# Patient Record
Sex: Male | Born: 1975 | Race: White | Hispanic: No | Marital: Married | Smoking: Former smoker
Health system: Southern US, Community
[De-identification: ages and names within clinical notes are randomized; demographics above are authoritative.]

## PROBLEM LIST (undated history)

## (undated) DIAGNOSIS — K219 Gastro-esophageal reflux disease without esophagitis: Secondary | ICD-10-CM

## (undated) DIAGNOSIS — I10 Essential (primary) hypertension: Secondary | ICD-10-CM

## (undated) DIAGNOSIS — N200 Calculus of kidney: Secondary | ICD-10-CM

## (undated) HISTORY — DX: Essential (primary) hypertension: I10

## (undated) HISTORY — DX: Gastro-esophageal reflux disease without esophagitis: K21.9

---

## 2006-01-26 ENCOUNTER — Emergency Department: Payer: Self-pay | Admitting: Emergency Medicine

## 2006-12-22 ENCOUNTER — Ambulatory Visit: Payer: Self-pay | Admitting: Family Medicine

## 2007-01-08 ENCOUNTER — Ambulatory Visit: Payer: Self-pay | Admitting: Specialist

## 2007-09-03 ENCOUNTER — Ambulatory Visit (HOSPITAL_BASED_OUTPATIENT_CLINIC_OR_DEPARTMENT_OTHER): Admission: RE | Admit: 2007-09-03 | Discharge: 2007-09-03 | Payer: Self-pay | Admitting: Orthopedic Surgery

## 2007-12-10 HISTORY — PX: ELBOW SURGERY: SHX618

## 2008-06-12 ENCOUNTER — Emergency Department: Payer: Self-pay | Admitting: Emergency Medicine

## 2008-06-15 ENCOUNTER — Emergency Department: Payer: Self-pay | Admitting: Emergency Medicine

## 2008-06-15 ENCOUNTER — Other Ambulatory Visit: Payer: Self-pay

## 2010-02-13 ENCOUNTER — Ambulatory Visit: Payer: Self-pay | Admitting: Orthopedic Surgery

## 2010-02-23 ENCOUNTER — Ambulatory Visit: Payer: Self-pay | Admitting: Orthopedic Surgery

## 2010-04-09 ENCOUNTER — Encounter: Payer: Self-pay | Admitting: Physician Assistant

## 2010-04-11 ENCOUNTER — Emergency Department: Payer: Self-pay | Admitting: Unknown Physician Specialty

## 2010-04-14 ENCOUNTER — Emergency Department: Payer: Self-pay | Admitting: Emergency Medicine

## 2010-04-18 ENCOUNTER — Ambulatory Visit: Payer: Self-pay | Admitting: Anesthesiology

## 2010-04-23 ENCOUNTER — Ambulatory Visit: Payer: Self-pay | Admitting: Unknown Physician Specialty

## 2010-11-28 ENCOUNTER — Other Ambulatory Visit: Payer: Self-pay

## 2011-01-17 ENCOUNTER — Emergency Department: Payer: Self-pay | Admitting: Emergency Medicine

## 2011-04-23 NOTE — Op Note (Signed)
NAME:  Daniel Jimenez              ACCOUNT NO.:  [id number]   MEDICAL RECORD NO.:  [id number]          PATIENT TYPE:  AMB   LOCATION:  [facility]                         FACILITY:  [facility]   PHYSICIAN:  Deidre Ala, M.D.    DATE OF BIRTH:  03/06/1976   DATE OF PROCEDURE:  09/03/2007  DATE OF DISCHARGE:                               OPERATIVE REPORT   PREOPERATIVE DIAGNOSIS:  Chronic right lateral elbow epicondylitis, ECRB  tendinitis.   POSTOPERATIVE DIAGNOSIS:  Chronic right lateral elbow epicondylitis,  ECRB tendinitis.   PROCEDURE:  1. Right lateral elbow debridement ECRB scar tissue with partial      lateral epicondylectomy and drilling.  2. Repair of ECRL over top.   SURGEON:  1. Charlesetta Shanks, M.D.   ASSISTANT:  Phineas Semen, P.A.-C.   ANESTHESIA:  General with LMA.   CULTURES:  None.   DRAINS:  None.   ESTIMATED BLOOD LOSS:  Minimal.   TOURNIQUET TIME:  40 minutes.   PATHOLOGIC FINDINGS AND HISTORY:  Daniel Jimenez is a 35 year old male who is a  worker's comp injury.  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  He  was evaluated and treated for multiple injuries.  He also had bilateral  elbow pain at both lateral elbow epicondyles.  Dr. Murray Hodgkins injected both  sides multiple times, the left side improved and is much less painful,  the right side was still painful.  An MRI of his right elbow showed a  lateral elbow epicondylitis syndrome with edema and under surface  tearing at the origin of the common extensor tendon.  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  Those  were all injected on his last office visit with good improvement in each  issue.  I did not inject the right lateral elbow due to the persistence  of the tendinitis.  It was elected to proceed with the above type  procedure.   At  surgery, he had classic sharpness of the lateral elbow epicondyle  with antisopathy of the ECRL, ECRB, and common extensor tendon with the  underlying ECRB somewhat fibrillated and degenerated.  We incised  longitudinally the ECRL and dissected it off the sharp lateral elbow  epicondyle.  We then debrided the ECRB.  We then decorticated the  lateral elbow of the epicondyle with a rongeur and smoothed it to a  gentle convex surface with decortication. We then chipped it up with a  curved osteotome to further stimulate neovascularity and multiply  drilled with a 0.62 K-wire.  We then repaired the ECRL longitudinally  over top to allow it to reattach back to the lateral elbow epicondyle.  Part of the synovial fringe of the radiocapitellar joint was incised as  well as the orbicular ligament.  A good repair was obtained after  closure of the ECRL.   DESCRIPTION OF PROCEDURE:  With adequate anesthesia obtained using LMA  technique, 1 gram  Ancef given IV prophylaxis, the patient was placed in  the supine position.  The right upper extremity was prepped from the  fingertips to the tourniquet in a standard fashion.  After standard  prepping and draping, Esmarch exsanguination was used. The tourniquet  was let up to 250 mmHg.  A lateral elbow hockey stick incision was then  made.  The incision was deepened sharply with the knife and hemostasis  obtained using the Bovie electrocoagulator.  Retractors were placed and  the ECRL was incised longitudinally and carefully dissected off its  attachment on the lateral elbow epicondyle.  I then removed the  radiocapitellar joint synovial fringe and incised further  longitudinally.  I then sharply excised the ECRB out of the lateral  epicondylar recess and distally about 1.5 cm.  I then thoroughly  decorticated the lateral elbow epicondyle with a rongeur to a smooth  gentle convex surface.  I chipped it up with a short curved osteotome  and multiply drilled  it with 0.62 K-wire to stimulate neovascularity.  Irrigation was then carried out and the ECRL longitudinally was closed  with running locking 0 Vicryl, running and interrupted 2-0 Vicryl was  used on the subcu, with 3-0 Monocryl with Steri-Strips.  0.5% Marcaine  was injected in and about the wound.  A bulky sterile compressive  dressing was applied with a sling and Ace without plaster splint and the  patient, having tolerated the procedure well, was awakened and taken to  the recovery room in satisfactory condition.  He is to be discharged per  outpatient routine, given Percocet for pain, and told call the office  for an appointment for recheck on Monday.           ______________________________  V. Charlesetta Shanks, M.D.     VEP/MEDQ  D:  09/03/2007  T:  09/03/2007  Job:  [id number]   cc:   Callie Fielding, M.D.  Fax: [phone]

## 2011-09-19 LAB — POCT HEMOGLOBIN-HEMACUE
Hemoglobin: 17.8 — ABNORMAL HIGH
Operator id: 268271

## 2012-06-04 ENCOUNTER — Emergency Department: Payer: Self-pay | Admitting: Emergency Medicine

## 2012-06-04 LAB — COMPREHENSIVE METABOLIC PANEL
Alkaline Phosphatase: 106 U/L (ref 50–136)
Chloride: 105 mmol/L (ref 98–107)
Co2: 28 mmol/L (ref 21–32)
EGFR (African American): >60
EGFR (Non-African Amer.): >60
SGPT (ALT): 32 U/L
Sodium: 141 mmol/L (ref 136–145)
Total Protein: 8 g/dL (ref 6.4–8.2)

## 2012-06-04 LAB — CBC
HCT: 45.3 % (ref 40.0–52.0)
HGB: 15.3 g/dL (ref 13.0–18.0)
MCH: 30.6 pg (ref 26.0–34.0)
WBC: 13.6 10*3/uL — ABNORMAL HIGH (ref 3.8–10.6)

## 2012-06-05 LAB — URINALYSIS, COMPLETE
Bacteria: NONE SEEN
Bilirubin,UR: NEGATIVE
Glucose,UR: NEGATIVE mg/dL (ref 0–75)
Nitrite: NEGATIVE
Protein: 100
RBC,UR: 1187 /HPF (ref 0–5)
Specific Gravity: 1.025 (ref 1.003–1.030)
Squamous Epithelial: 1
WBC UR: 12 /HPF (ref 0–5)

## 2012-06-05 LAB — CK: CK, Total: 142 U/L (ref 35–232)

## 2012-06-16 ENCOUNTER — Emergency Department: Payer: Self-pay | Admitting: *Deleted

## 2012-06-16 LAB — BASIC METABOLIC PANEL
Calcium, Total: 9 mg/dL (ref 8.5–10.1)
Co2: 27 mmol/L (ref 21–32)
EGFR (African American): >60
Glucose: 99 mg/dL (ref 65–99)
Sodium: 140 mmol/L (ref 136–145)

## 2012-06-16 LAB — CBC
HGB: 14.7 g/dL (ref 13.0–18.0)
MCHC: 33.4 g/dL (ref 32.0–36.0)
Platelet: 242 10*3/uL (ref 150–440)
RBC: 4.89 10*6/uL (ref 4.40–5.90)

## 2012-06-17 LAB — URINALYSIS, COMPLETE
Bacteria: NONE SEEN
Ketone: NEGATIVE
Specific Gravity: 1.012 (ref 1.003–1.030)
Squamous Epithelial: NONE SEEN
WBC UR: <1 /HPF (ref 0–5)

## 2012-06-22 ENCOUNTER — Ambulatory Visit: Payer: Self-pay | Admitting: Urology

## 2013-12-03 ENCOUNTER — Emergency Department: Payer: Self-pay | Admitting: Emergency Medicine

## 2013-12-03 LAB — CBC WITH DIFFERENTIAL/PLATELET
Lymphocyte %: 28.2 %
MCHC: 34.1 g/dL (ref 32.0–36.0)
Monocyte #: 1 x10 3/mm (ref 0.2–1.0)
RBC: 4.94 10*6/uL (ref 4.40–5.90)
RDW: 13.3 % (ref 11.5–14.5)

## 2013-12-03 LAB — DRUG SCREEN, URINE
Amphetamines, Ur Screen: NEGATIVE (ref ?–1000)
Benzodiazepine, Ur Scrn: NEGATIVE (ref ?–200)
Cannabinoid 50 Ng, Ur ~~LOC~~: NEGATIVE (ref ?–50)
MDMA (Ecstasy)Ur Screen: NEGATIVE (ref ?–500)
Methadone, Ur Screen: NEGATIVE (ref ?–300)
Phencyclidine (PCP) Ur S: NEGATIVE (ref ?–25)
Tricyclic, Ur Screen: NEGATIVE (ref ?–1000)

## 2013-12-03 LAB — COMPREHENSIVE METABOLIC PANEL
Alkaline Phosphatase: 88 U/L
BUN: 12 mg/dL (ref 7–18)
Bilirubin,Total: 0.2 mg/dL (ref 0.2–1.0)
Osmolality: 276 (ref 275–301)
Potassium: 3.6 mmol/L (ref 3.5–5.1)
SGOT(AST): 27 U/L (ref 15–37)
SGPT (ALT): 35 U/L (ref 12–78)
Sodium: 138 mmol/L (ref 136–145)
Total Protein: 7.3 g/dL (ref 6.4–8.2)

## 2013-12-03 LAB — URINALYSIS, COMPLETE
Glucose,UR: NEGATIVE mg/dL (ref 0–75)
Ph: 7 (ref 4.5–8.0)
Protein: NEGATIVE
RBC,UR: NONE SEEN /HPF (ref 0–5)
Specific Gravity: 1.013 (ref 1.003–1.030)

## 2013-12-03 LAB — PROTIME-INR: INR: 0.9

## 2014-05-19 ENCOUNTER — Emergency Department: Payer: Self-pay | Admitting: Emergency Medicine

## 2014-05-19 LAB — CBC
HCT: 44.9 % (ref 40.0–52.0)
HGB: 14.8 g/dL (ref 13.0–18.0)
MCH: 30.2 pg (ref 26.0–34.0)
MCHC: 32.9 g/dL (ref 32.0–36.0)
MCV: 92 fL (ref 80–100)
Platelet: 260 10*3/uL (ref 150–440)
RBC: 4.9 10*6/uL (ref 4.40–5.90)
RDW: 13 % (ref 11.5–14.5)
WBC: 16.5 10*3/uL — AB (ref 3.8–10.6)

## 2014-05-19 LAB — COMPREHENSIVE METABOLIC PANEL
AST: 20 U/L (ref 15–37)
Albumin: 4.1 g/dL (ref 3.4–5.0)
Alkaline Phosphatase: 78 U/L
Anion Gap: 7 (ref 7–16)
BILIRUBIN TOTAL: 0.3 mg/dL (ref 0.2–1.0)
BUN: 12 mg/dL (ref 7–18)
CO2: 30 mmol/L (ref 21–32)
CREATININE: 0.99 mg/dL (ref 0.60–1.30)
Calcium, Total: 8.9 mg/dL (ref 8.5–10.1)
Chloride: 104 mmol/L (ref 98–107)
EGFR (Non-African Amer.): >60
Glucose: 129 mg/dL — ABNORMAL HIGH (ref 65–99)
Osmolality: 283 (ref 275–301)
POTASSIUM: 4.2 mmol/L (ref 3.5–5.1)
SGPT (ALT): 25 U/L (ref 12–78)
Sodium: 141 mmol/L (ref 136–145)
TOTAL PROTEIN: 7.1 g/dL (ref 6.4–8.2)

## 2015-04-28 ENCOUNTER — Other Ambulatory Visit: Payer: Self-pay | Admitting: Internal Medicine

## 2015-04-28 ENCOUNTER — Ambulatory Visit
Admission: RE | Admit: 2015-04-28 | Discharge: 2015-04-28 | Disposition: A | Discharge status: Home / Self Care | Payer: 59 | Source: Ambulatory Visit | Attending: Internal Medicine | Admitting: Internal Medicine

## 2015-04-28 DIAGNOSIS — M25521 Pain in right elbow: Secondary | ICD-10-CM

## 2015-04-28 DIAGNOSIS — M25522 Pain in left elbow: Secondary | ICD-10-CM | POA: Diagnosis present

## 2015-08-21 ENCOUNTER — Ambulatory Visit
Admission: RE | Admit: 2015-08-21 | Discharge: 2015-08-21 | Disposition: A | Discharge status: Home / Self Care | Payer: 59 | Source: Ambulatory Visit | Attending: Nurse Practitioner | Admitting: Nurse Practitioner

## 2015-08-21 ENCOUNTER — Other Ambulatory Visit: Payer: Self-pay | Admitting: Nurse Practitioner

## 2015-08-21 DIAGNOSIS — R05 Cough: Secondary | ICD-10-CM | POA: Insufficient documentation

## 2015-08-21 DIAGNOSIS — R059 Cough, unspecified: Secondary | ICD-10-CM

## 2016-01-01 ENCOUNTER — Ambulatory Visit
Admission: RE | Admit: 2016-01-01 | Discharge: 2016-01-01 | Disposition: A | Discharge status: Home / Self Care | Payer: 59 | Source: Ambulatory Visit | Attending: Nurse Practitioner | Admitting: Nurse Practitioner

## 2016-01-01 ENCOUNTER — Other Ambulatory Visit: Payer: Self-pay | Admitting: Nurse Practitioner

## 2016-01-01 DIAGNOSIS — M542 Cervicalgia: Secondary | ICD-10-CM | POA: Diagnosis present

## 2016-01-01 DIAGNOSIS — M545 Low back pain: Secondary | ICD-10-CM

## 2016-01-01 DIAGNOSIS — M47814 Spondylosis without myelopathy or radiculopathy, thoracic region: Secondary | ICD-10-CM | POA: Diagnosis not present

## 2016-01-01 DIAGNOSIS — R52 Pain, unspecified: Secondary | ICD-10-CM

## 2016-01-01 DIAGNOSIS — M549 Dorsalgia, unspecified: Secondary | ICD-10-CM | POA: Diagnosis present

## 2016-01-01 DIAGNOSIS — M25512 Pain in left shoulder: Secondary | ICD-10-CM | POA: Diagnosis not present

## 2016-01-22 ENCOUNTER — Other Ambulatory Visit: Payer: Self-pay | Admitting: Nurse Practitioner

## 2016-01-22 DIAGNOSIS — M542 Cervicalgia: Secondary | ICD-10-CM

## 2016-01-22 DIAGNOSIS — M5489 Other dorsalgia: Secondary | ICD-10-CM

## 2016-02-09 ENCOUNTER — Ambulatory Visit
Admission: RE | Admit: 2016-02-09 | Discharge: 2016-02-09 | Disposition: A | Discharge status: Home / Self Care | Payer: 59 | Source: Ambulatory Visit | Attending: Nurse Practitioner | Admitting: Nurse Practitioner

## 2016-02-09 DIAGNOSIS — M47812 Spondylosis without myelopathy or radiculopathy, cervical region: Secondary | ICD-10-CM | POA: Insufficient documentation

## 2016-02-09 DIAGNOSIS — M50323 Other cervical disc degeneration at C6-C7 level: Secondary | ICD-10-CM | POA: Diagnosis not present

## 2016-02-09 DIAGNOSIS — M5489 Other dorsalgia: Secondary | ICD-10-CM

## 2016-02-09 DIAGNOSIS — M5136 Other intervertebral disc degeneration, lumbar region: Secondary | ICD-10-CM | POA: Diagnosis not present

## 2016-02-09 DIAGNOSIS — M50322 Other cervical disc degeneration at C5-C6 level: Secondary | ICD-10-CM | POA: Diagnosis not present

## 2016-02-09 DIAGNOSIS — M545 Low back pain: Secondary | ICD-10-CM | POA: Insufficient documentation

## 2016-02-09 DIAGNOSIS — M50321 Other cervical disc degeneration at C4-C5 level: Secondary | ICD-10-CM | POA: Diagnosis not present

## 2016-02-09 DIAGNOSIS — M4806 Spinal stenosis, lumbar region: Secondary | ICD-10-CM | POA: Diagnosis not present

## 2016-02-09 DIAGNOSIS — M542 Cervicalgia: Secondary | ICD-10-CM | POA: Diagnosis present

## 2016-05-01 ENCOUNTER — Emergency Department
Admission: EM | Admit: 2016-05-01 | Discharge: 2016-05-01 | Disposition: A | Discharge status: Home / Self Care | Payer: 59 | Attending: Emergency Medicine | Admitting: Emergency Medicine

## 2016-05-01 ENCOUNTER — Emergency Department: Payer: 59

## 2016-05-01 ENCOUNTER — Encounter: Payer: Self-pay | Admitting: Emergency Medicine

## 2016-05-01 DIAGNOSIS — F17210 Nicotine dependence, cigarettes, uncomplicated: Secondary | ICD-10-CM | POA: Insufficient documentation

## 2016-05-01 DIAGNOSIS — N200 Calculus of kidney: Secondary | ICD-10-CM | POA: Diagnosis not present

## 2016-05-01 DIAGNOSIS — R109 Unspecified abdominal pain: Secondary | ICD-10-CM | POA: Diagnosis present

## 2016-05-01 HISTORY — DX: Calculus of kidney: N20.0

## 2016-05-01 LAB — BASIC METABOLIC PANEL
Anion gap: 7 (ref 5–15)
BUN: 13 mg/dL (ref 6–20)
CALCIUM: 9.2 mg/dL (ref 8.9–10.3)
CHLORIDE: 104 mmol/L (ref 101–111)
CO2: 29 mmol/L (ref 22–32)
CREATININE: 0.83 mg/dL (ref 0.61–1.24)
GFR calc Af Amer: >60 mL/min (ref >60)
GFR calc non Af Amer: >60 mL/min (ref >60)
Glucose, Bld: 120 mg/dL — ABNORMAL HIGH (ref 65–99)
Potassium: 3.6 mmol/L (ref 3.5–5.1)
SODIUM: 140 mmol/L (ref 135–145)

## 2016-05-01 LAB — CBC WITH DIFFERENTIAL/PLATELET
Basophils Absolute: 0.1 10*3/uL (ref 0–0.1)
EOS ABS: 0.4 10*3/uL (ref 0–0.7)
HCT: 44.5 % (ref 40.0–52.0)
HEMOGLOBIN: 15 g/dL (ref 13.0–18.0)
LYMPHS ABS: 4.7 10*3/uL — AB (ref 1.0–3.6)
Lymphocytes Relative: 32 %
MCH: 30.3 pg (ref 26.0–34.0)
MCHC: 33.7 g/dL (ref 32.0–36.0)
MCV: 89.9 fL (ref 80.0–100.0)
MONO ABS: 1.3 10*3/uL — AB (ref 0.2–1.0)
Neutro Abs: 8.6 10*3/uL — ABNORMAL HIGH (ref 1.4–6.5)
Platelets: 275 10*3/uL (ref 150–440)
RBC: 4.95 MIL/uL (ref 4.40–5.90)
RDW: 14.2 % (ref 11.5–14.5)
WBC: 15 10*3/uL — ABNORMAL HIGH (ref 3.8–10.6)

## 2016-05-01 LAB — URINALYSIS COMPLETE WITH MICROSCOPIC (ARMC ONLY)
BACTERIA UA: NONE SEEN
Bilirubin Urine: NEGATIVE
GLUCOSE, UA: NEGATIVE mg/dL
Ketones, ur: NEGATIVE mg/dL
Leukocytes, UA: NEGATIVE
Nitrite: NEGATIVE
PROTEIN: NEGATIVE mg/dL
Specific Gravity, Urine: 1.02 (ref 1.005–1.030)
pH: 5 (ref 5.0–8.0)

## 2016-05-01 MED ORDER — KETOROLAC TROMETHAMINE 30 MG/ML IJ SOLN
INTRAMUSCULAR | Status: AC
  Administered 2016-05-01: 30 mg via INTRAVENOUS
  Filled 2016-05-01: qty 1

## 2016-05-01 MED ORDER — SODIUM CHLORIDE 0.9 % IV BOLUS (SEPSIS)
1000.0000 mL | Freq: Once | INTRAVENOUS | Status: AC
  Administered 2016-05-01: 1000 mL via INTRAVENOUS

## 2016-05-01 MED ORDER — OXYCODONE-ACETAMINOPHEN 5-325 MG PO TABS
2.0000 | ORAL_TABLET | Freq: Once | ORAL | Status: AC
  Administered 2016-05-01: 2 via ORAL
  Filled 2016-05-01: qty 2

## 2016-05-01 MED ORDER — ONDANSETRON HCL 4 MG/2ML IJ SOLN
INTRAMUSCULAR | Status: AC
  Administered 2016-05-01: 4 mg via INTRAVENOUS
  Filled 2016-05-01: qty 2

## 2016-05-01 MED ORDER — TAMSULOSIN HCL 0.4 MG PO CAPS
0.4000 mg | ORAL_CAPSULE | Freq: Once | ORAL | Status: AC
  Administered 2016-05-01: 0.4 mg via ORAL
  Filled 2016-05-01: qty 1

## 2016-05-01 MED ORDER — ONDANSETRON HCL 4 MG/2ML IJ SOLN
4.0000 mg | Freq: Once | INTRAMUSCULAR | Status: AC
  Administered 2016-05-01: 4 mg via INTRAVENOUS

## 2016-05-01 MED ORDER — HYDROMORPHONE HCL 1 MG/ML IJ SOLN
INTRAMUSCULAR | Status: AC
  Administered 2016-05-01: 1 mg via INTRAVENOUS
  Filled 2016-05-01: qty 1

## 2016-05-01 MED ORDER — OXYCODONE-ACETAMINOPHEN 5-325 MG PO TABS: 1.0000 | ORAL_TABLET | ORAL | Status: DC | PRN

## 2016-05-01 MED ORDER — HYDROMORPHONE HCL 1 MG/ML IJ SOLN
1.0000 mg | Freq: Once | INTRAMUSCULAR | Status: AC
  Administered 2016-05-01: 1 mg via INTRAVENOUS

## 2016-05-01 MED ORDER — ONDANSETRON 4 MG PO TBDP: 4.0000 mg | ORAL_TABLET | Freq: Three times a day (TID) | ORAL | Status: DC | PRN

## 2016-05-01 MED ORDER — KETOROLAC TROMETHAMINE 30 MG/ML IJ SOLN
30.0000 mg | Freq: Once | INTRAMUSCULAR | Status: AC
  Administered 2016-05-01: 30 mg via INTRAVENOUS

## 2016-05-01 MED ORDER — TAMSULOSIN HCL 0.4 MG PO CAPS: 0.4000 mg | ORAL_CAPSULE | Freq: Every day | ORAL | Status: DC

## 2016-05-01 MED ORDER — HYDROMORPHONE HCL 1 MG/ML IJ SOLN
1.0000 mg | Freq: Once | INTRAMUSCULAR | Status: AC
  Administered 2016-05-01: 1 mg via INTRAVENOUS
  Filled 2016-05-01: qty 1

## 2016-05-01 MED ORDER — ONDANSETRON 4 MG PO TBDP
4.0000 mg | ORAL_TABLET | Freq: Once | ORAL | Status: AC
  Administered 2016-05-01: 4 mg via ORAL
  Filled 2016-05-01: qty 1

## 2016-05-01 NOTE — Discharge Instructions (Signed)
1. Take pain & nausea medicines as needed (Percocet/Zofran #30). Make sure to take a stool softener while taking narcotic pain medicines. 2. Take Flomax 0.4mg  daily x 14 days. 3. Drink plenty of bottled or filtered water daily. 4. Return to the ER for worsening symptoms, persistent vomiting, fever, difficulty breathing or other concerns.  Kidney Stones Kidney stones (urolithiasis) are solid masses that form inside your kidneys. The intense pain is caused by the stone moving through the kidney, ureter, bladder, and urethra (urinary tract). When the stone moves, the ureter starts to spasm around the stone. The stone is usually passed in your pee (urine).  HOME CARE  Drink enough fluids to keep your pee clear or pale yellow. This helps to get the stone out.  Take a 24-hour pee (urine) sample as told by your doctor. You may need to take another sample every 6-12 months.  Strain all pee through the provided strainer. Do not pee without peeing through the strainer, not even once. If you pee the stone out, catch it in the strainer. The stone may be as small as a grain of salt. Take this to your doctor. This will help your doctor figure out what you can do to try to prevent more kidney stones.  Only take medicine as told by your doctor.  Make changes to your daily diet as told by your doctor. You may be told to:  Limit how much salt you eat.  Eat 5 or more servings of fruits and vegetables each day.  Limit how much meat, poultry, fish, and eggs you eat.  Keep all follow-up visits as told by your doctor. This is important.  Get follow-up X-rays as told by your doctor. GET HELP IF: You have pain that gets worse even if you have been taking pain medicine. GET HELP RIGHT AWAY IF:   Your pain does not get better with medicine.  You have a fever or shaking chills.  Your pain increases and gets worse over 18 hours.  You have new belly (abdominal) pain.  You feel faint or pass out.  You are  unable to pee.   This information is not intended to replace advice given to you by your health care provider. Make sure you discuss any questions you have with your health care provider.   Document Released: 05/13/2008 Document Revised: 08/16/2015 Document Reviewed: 04/28/2013 Elsevier Interactive Patient Education 2016 Elsevier Inc.  Flank Pain Flank pain is pain in your side. The flank is the area of your side between your upper belly (abdomen) and your back. Pain in this area can be caused by many different things. HOME CARE Home care and treatment will depend on the cause of your pain.  Rest as told by your doctor.  Drink enough fluids to keep your pee (urine) clear or pale yellow.  Only take medicine as told by your doctor.  Tell your doctor about any changes in your pain.  Follow up with your doctor. GET HELP RIGHT AWAY IF:   Your pain does not get better with medicine.   You have new symptoms or your symptoms get worse.  Your pain gets worse.   You have belly (abdominal) pain.   You are short of breath.   You always feel sick to your stomach (nauseous).   You keep throwing up (vomiting).   You have puffiness (swelling) in your belly.   You feel light-headed or you pass out (faint).   You have blood in your pee.  You have a fever or lasting symptoms for more than 2-3 days.  You have a fever and your symptoms suddenly get worse. MAKE SURE YOU:   Understand these instructions.  Will watch your condition.  Will get help right away if you are not doing well or get worse.   This information is not intended to replace advice given to you by your health care provider. Make sure you discuss any questions you have with your health care provider.   Document Released: 09/03/2008 Document Revised: 12/16/2014 Document Reviewed: 07/09/2012 Elsevier Interactive Patient Education [employer]2016 Elsevier Inc.

## 2016-05-01 NOTE — ED Provider Notes (Signed)
[facility] Emergency Department Provider Note   ____________________________________________  Time seen: Approximately 4:34 AM  I have reviewed the triage vital signs and the nursing notes.   HISTORY  Chief Complaint Flank Pain    HPI Daniel Jimenez is a 40 y.o. male who presents to the ED via EMS with a chief complaint of left flank pain. Patient awoke with left flank pain radiating to left groin; onset approximately 2 AM. Symptoms associated with nausea and vomiting. Patient reports history of kidney stones and states this feels similarly. Last imaging approximately 2 years ago. He has never required lithotripsy or stenting; largest stone he passed was 5.5 mm. Denies associated fever, chills, chest pain, shortness of breath, diarrhea. States he was dribbling on urination prior to arrival. Denies recent travel or trauma. Nothing makes his symptoms better or worse.   Past Medical History Kidney stones  There are no active problems to display for this patient.   History reviewed. No pertinent past surgical history.  Current Outpatient Rx  Name  Route  Sig  Dispense  Refill  . ondansetron (ZOFRAN ODT) 4 MG disintegrating tablet   Oral   Take 1 tablet (4 mg total) by mouth every 8 (eight) hours as needed for nausea or vomiting.   30 tablet   0   . oxyCODONE-acetaminophen (ROXICET) 5-325 MG tablet   Oral   Take 1 tablet by mouth every 4 (four) hours as needed for severe pain.   30 tablet   0   . tamsulosin (FLOMAX) 0.4 MG CAPS capsule   Oral   Take 1 capsule (0.4 mg total) by mouth daily.   14 capsule   0     Allergies Review of patient's allergies indicates no known allergies.  History reviewed. No pertinent family history.  Social History Social History  Substance Use Topics  . Smoking status: Current Every Day Smoker -- 1.00 packs/day    Types: Cigarettes  . Smokeless tobacco: Never Used  . Alcohol Use: Yes     Comment: oc    Denies recent alcohol use  Review of Systems  Constitutional: No fever/chills. Eyes: No visual changes. ENT: No sore throat. Cardiovascular: Denies chest pain. Respiratory: Denies shortness of breath. Gastrointestinal: Positive for left flank pain. No abdominal pain.  Positive for nausea and vomiting.  No diarrhea.  No constipation. Genitourinary: Positive for urinary hesitancy. Negative for dysuria. Negative for testicular pain or swelling. Musculoskeletal: Negative for back pain. Skin: Negative for rash. Neurological: Negative for headaches, focal weakness or numbness.  10-point ROS otherwise negative.  ____________________________________________   PHYSICAL EXAM:  VITAL SIGNS: ED Triage Vitals  Enc Vitals Group     BP --      Pulse --      Resp --      Temp --      Temp src --      SpO2 --      Weight --      Height --      Head Cir --      Peak Flow --      Pain Score --      Pain Loc --      Pain Edu? --      Excl. in GC? --     Constitutional: Alert and oriented. Well appearing and in moderate acute distress. Eyes: Conjunctivae are normal. PERRL. EOMI. Head: Atraumatic. Nose: No congestion/rhinnorhea. Mouth/Throat: Mucous membranes are moist.  Oropharynx non-erythematous. Neck: No stridor.  Cardiovascular: Normal rate, regular rhythm. Grossly normal heart sounds.  Good peripheral circulation. Respiratory: Normal respiratory effort.  No retractions. Lungs CTAB. Gastrointestinal: Soft and nontender. No distention. No abdominal bruits. Mild left CVA tenderness. Musculoskeletal: No lower extremity tenderness nor edema.  No joint effusions. Neurologic:  Normal speech and language. No gross focal neurologic deficits are appreciated. No gait instability. Skin:  Skin is warm, dry and intact. No rash noted. Psychiatric: Mood and affect are normal. Speech and behavior are normal.  ____________________________________________   LABS (all labs ordered are  listed, but only abnormal results are displayed)  Labs Reviewed  CBC WITH DIFFERENTIAL/PLATELET - Abnormal; Notable for the following:    WBC 15.0 (*)    Neutro Abs 8.6 (*)    Lymphs Abs 4.7 (*)    Monocytes Absolute 1.3 (*)    All other components within normal limits  BASIC METABOLIC PANEL - Abnormal; Notable for the following:    Glucose, Bld 120 (*)    All other components within normal limits  URINALYSIS COMPLETEWITH MICROSCOPIC ([facility] ONLY) - Abnormal; Notable for the following:    Color, Urine YELLOW (*)    APPearance CLEAR (*)    Hgb urine dipstick 3+ (*)    Squamous Epithelial / LPF 0-5 (*)    All other components within normal limits   ____________________________________________  EKG  None ____________________________________________  RADIOLOGY  CT renal colic interpreted per Dr. Manus GunningEhinger: 1. Obstructing 3 mm stone in the distal left ureter just proximal to the ureterovesicular junction with mild hydroureteronephrosis. Over 2 nonobstructing stones in the right kidney. 2. Avascular necrosis of both femoral heads, incidentally noted. ____________________________________________   PROCEDURES  Procedure(s) performed: None  Critical Care performed: No  ____________________________________________   INITIAL IMPRESSION / ASSESSMENT AND PLAN / ED COURSE  Pertinent labs & imaging results that were available during my care of the patient were reviewed by me and considered in my medical decision making (see chart for details).  40 year old male who presents with sudden onset left flank pain, history of kidney stones. Will obtain screening lab work, administer IV analgesia and antiemetic and reassess.  ----------------------------------------- 5:38 AM on 05/01/2016 -----------------------------------------  Pain improving after second dose of Dilaudid. Will obtain CT renal study.  ----------------------------------------- 6:50 AM on  05/01/2016 -----------------------------------------  Patient was given additional oral analgesia with improvement in pain. Updated patient of laboratory and CT imaging results. Plan for prescriptions of analgesia, antiemetic, Flomax and follow up with urology as needed. Patient verbalizes understanding and agrees with plan of care. ____________________________________________   FINAL CLINICAL IMPRESSION(S) / ED DIAGNOSES  Final diagnoses:  Flank pain  Kidney stone      NEW MEDICATIONS STARTED DURING THIS VISIT:  Discharge Medication List as of 05/01/2016  6:22 AM    START taking these medications   Details  ondansetron (ZOFRAN ODT) 4 MG disintegrating tablet Take 1 tablet (4 mg total) by mouth every 8 (eight) hours as needed for nausea or vomiting., Starting 05/01/2016, Until Discontinued, Print    oxyCODONE-acetaminophen (ROXICET) 5-325 MG tablet Take 1 tablet by mouth every 4 (four) hours as needed for severe pain., Starting 05/01/2016, Until Discontinued, Print    tamsulosin (FLOMAX) 0.4 MG CAPS capsule Take 1 capsule (0.4 mg total) by mouth daily., Starting 05/01/2016, Until Discontinued, Print         Note:  This document was prepared using Dragon voice recognition software and may include unintentional dictation errors.    Irean HongJade J Toriann Spadoni, MD 05/01/16 [phone]

## 2016-05-01 NOTE — ED Notes (Signed)
Patient transported to CT 

## 2016-05-01 NOTE — ED Notes (Signed)
Pt back from CT

## 2016-05-01 NOTE — ED Notes (Signed)
Pt presents to ED via EMS with c/o left sided flank pain, onset around 200 this morning. Pt reports Hx of kidney stones.

## 2016-08-02 IMAGING — MR MR LUMBAR SPINE W/O CM
4 of 5 series · 15 of 48 positions shown · non-contrast
Comparison: Multiple exams, including 01/01/2016 an 04/18/2010

CLINICAL DATA: Low back pain radiating into the groin. Symptoms for
10 years.

EXAM:
MRI LUMBAR SPINE WITHOUT CONTRAST
TECHNIQUE: Multiplanar, multisequence MR imaging of the lumbar spine was
performed. No intravenous contrast was administered.

[Series 2: T2 · sagittal · 4.0mm · 0.44mm/px · 6 of 15 slices shown (1 of 2)]
[im 1/15]
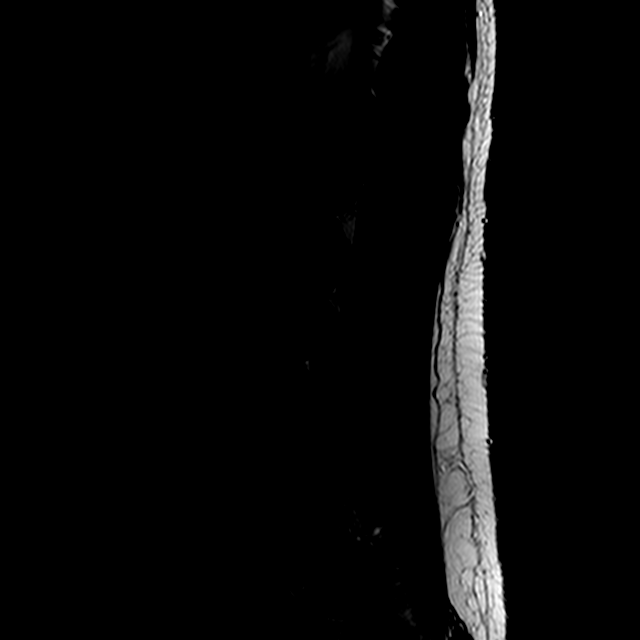
[im 3/15]
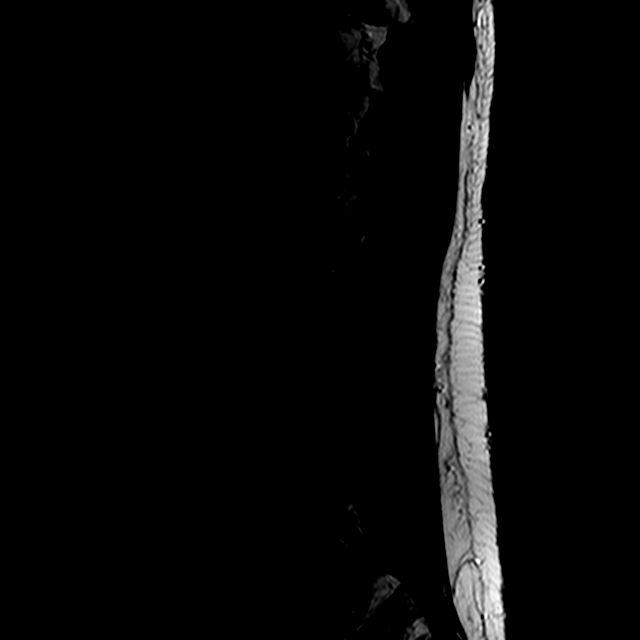
[im 6/15]
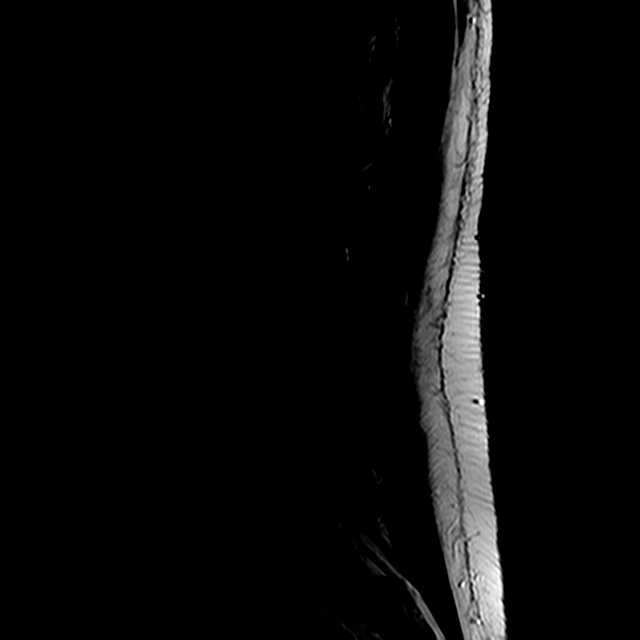
[im 9/15]
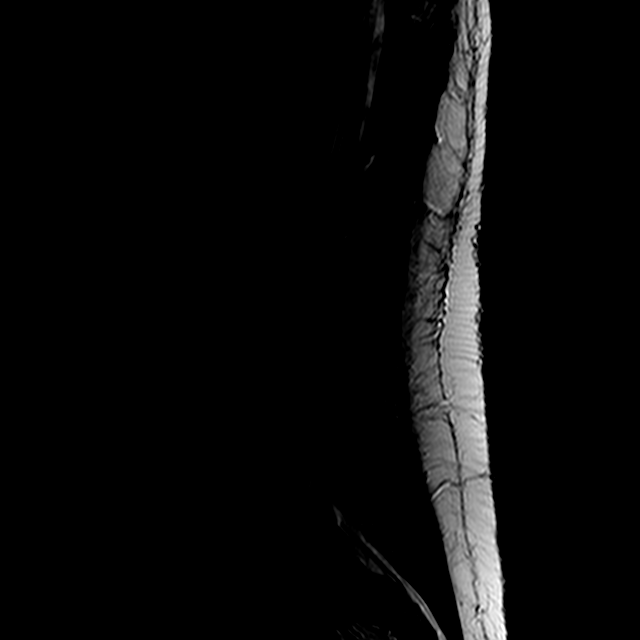
[im 12/15]
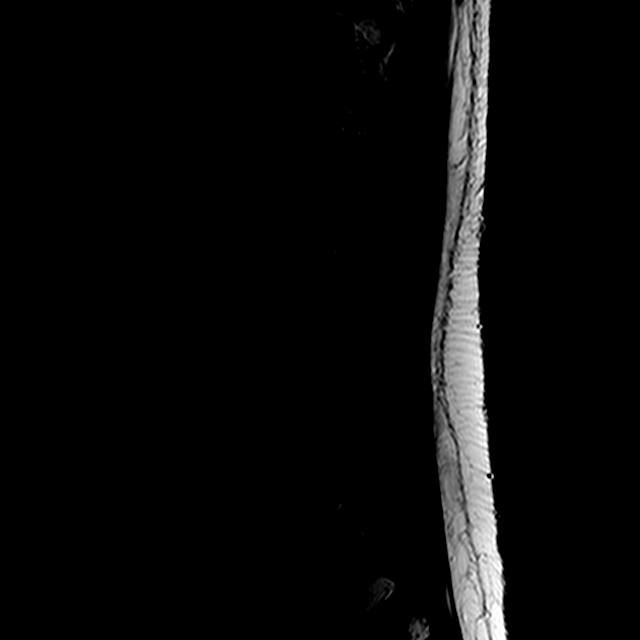
[im 15/15]
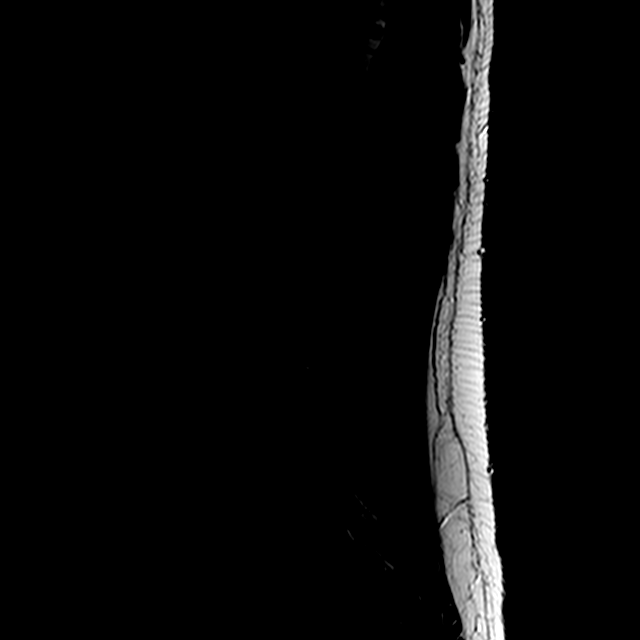

[Series 3: T1 · sagittal · 4.0mm · 0.44mm/px · 3 of 15 slices shown (1 of 2)]
[im 3/15]
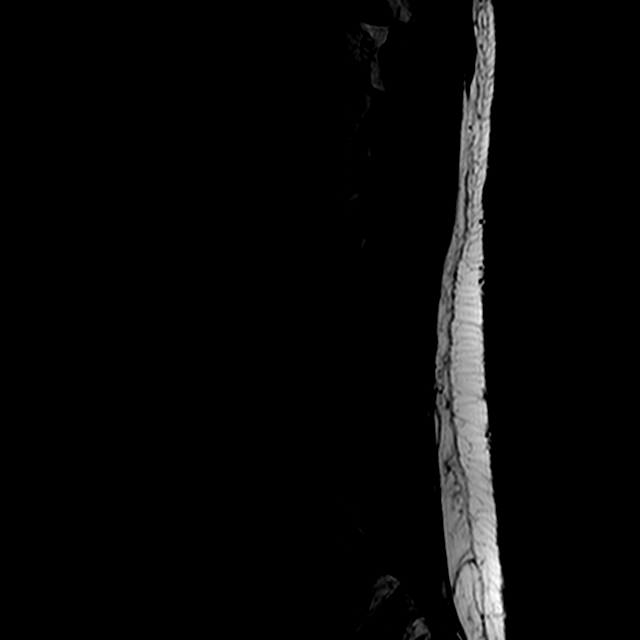
[im 9/15]
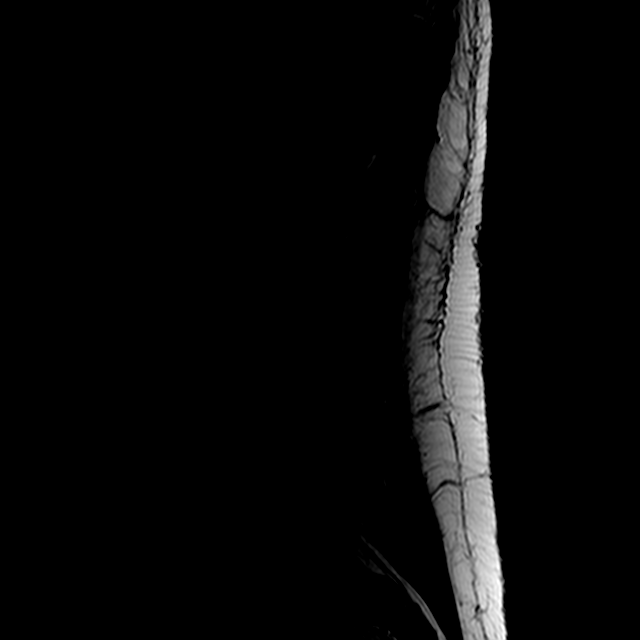
[im 15/15]
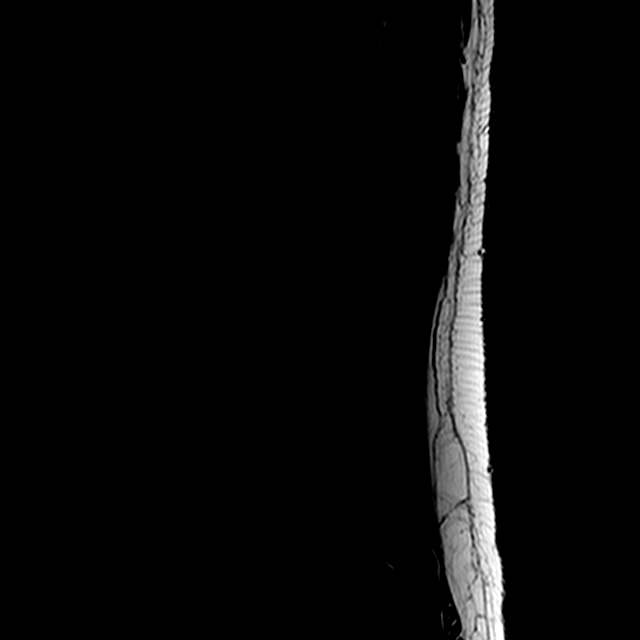

[Series 5: T2 · axial · 4.0mm · 0.39mm/px · z∈[-69,+117]mm · 3 of 37 slices shown (2 of 2)]
[im 6/37]
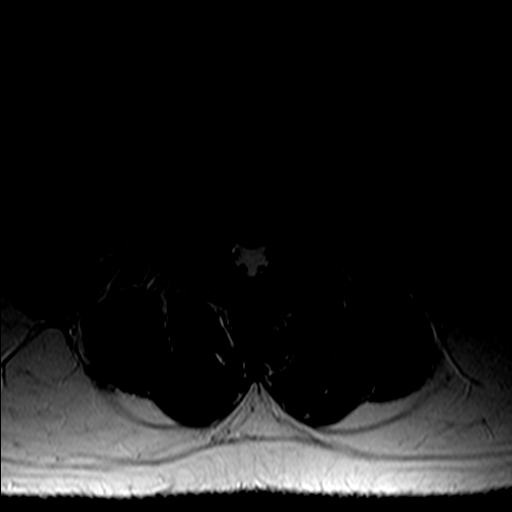
[im 19/37]
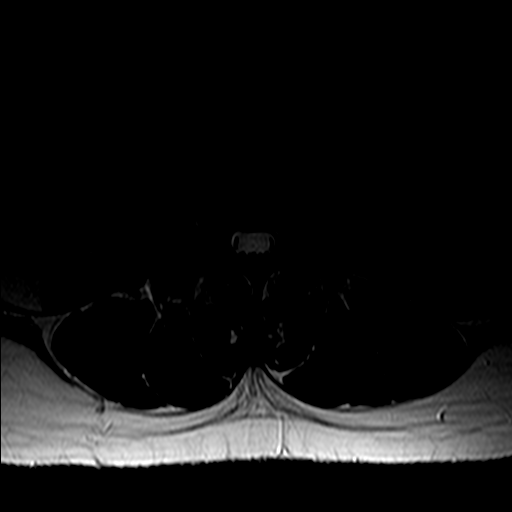
[im 31/37]
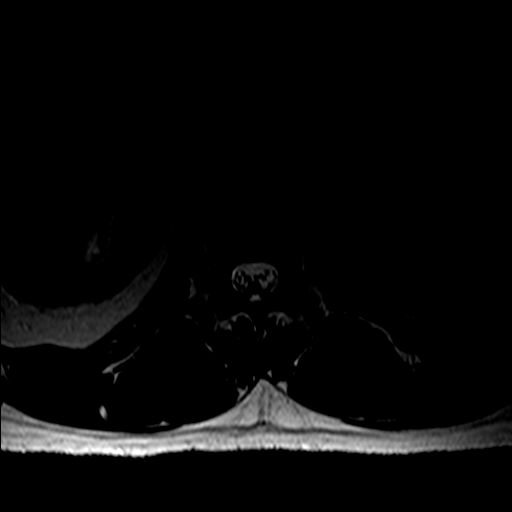

[Series 6: T1 · axial · 4.0mm · 0.39mm/px · z∈[-69,+117]mm · 3 of 37 slices shown (2 of 2)]
[im 6/37]
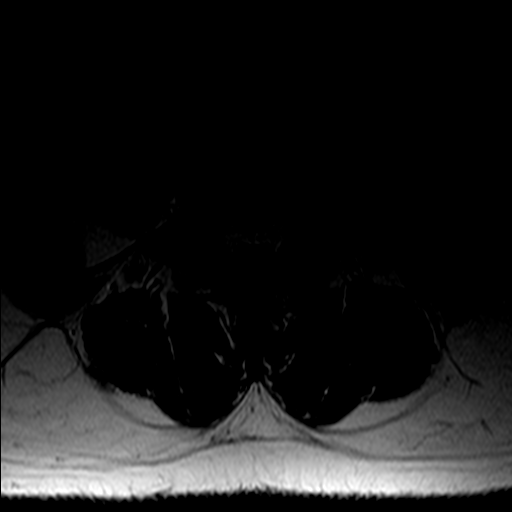
[im 19/37]
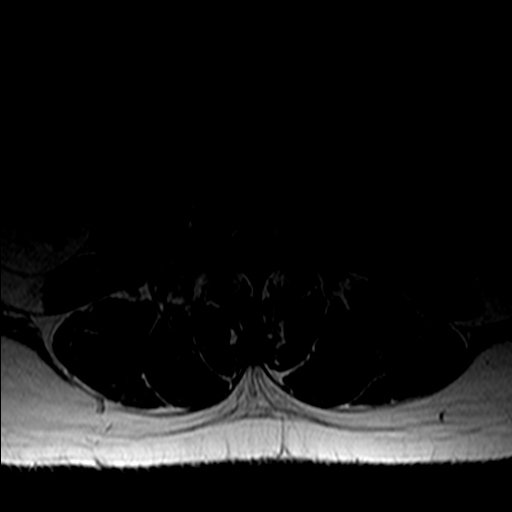
[im 31/37]
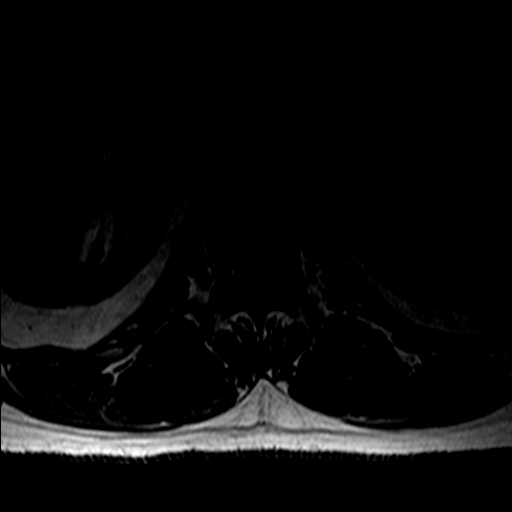

[15 of 48 positions shown; findings below may reference images not displayed]

FINDINGS: The lowest lumbar type non-rib-bearing vertebra is labeled as L5.
The conus medullaris appears normal. Conus level: L1.

Type 2 degenerative endplate findings anteriorly at the L1-2 level
with mild disc desiccation at L1-2, L4-5, and L5-S1. Loss of disc
height at L5-S1.

Type 1 degenerative endplate findings noted at the L5-S1 level.

No significant paraspinal abnormalities.

Additional findings at individual levels are as follows:

T12-L1: Unremarkable.

L1-2:  Unremarkable.

L2-3:  Unremarkable.

L3-4:  No impingement.  Mild disc bulge.

L4-5: No impingement. Disc bulge with small central disc protrusion.

L5-S1: Mild bilateral foraminal stenosis due to disc bulge, facet
arthropathy, and intervertebral spurring, worsened bilaterally
compared to 04/18/10.
IMPRESSION: 1. Mild bilateral foraminal stenosis at L5-S1, worsened compared to
the prior MRI.
2. Mild degenerative disc disease at L3-4 and L4-5 without
impingement at those levels.

## 2018-01-22 ENCOUNTER — Other Ambulatory Visit: Payer: Self-pay | Admitting: Nurse Practitioner

## 2018-01-22 DIAGNOSIS — F5101 Primary insomnia: Secondary | ICD-10-CM

## 2018-01-22 DIAGNOSIS — M064 Inflammatory polyarthropathy: Secondary | ICD-10-CM

## 2018-01-22 MED ORDER — PREDNISONE 10 MG (21) PO TBPK: ORAL_TABLET | ORAL | 0 refills | Status: DC

## 2018-01-22 MED ORDER — ZOLPIDEM TARTRATE 10 MG PO TABS: 10.0000 mg | ORAL_TABLET | Freq: Every evening | ORAL | 1 refills | Status: DC | PRN

## 2018-02-03 ENCOUNTER — Encounter: Payer: Self-pay | Admitting: Nurse Practitioner

## 2018-02-03 ENCOUNTER — Ambulatory Visit: Payer: 59 | Admitting: Nurse Practitioner

## 2018-02-03 VITALS — BP 139/92 | HR 94 | Resp 16 | Ht 71.0 in | Wt 220.0 lb

## 2018-02-03 DIAGNOSIS — M5116 Intervertebral disc disorders with radiculopathy, lumbar region: Secondary | ICD-10-CM | POA: Diagnosis not present

## 2018-02-03 DIAGNOSIS — I10 Essential (primary) hypertension: Secondary | ICD-10-CM | POA: Diagnosis not present

## 2018-02-03 DIAGNOSIS — M19022 Primary osteoarthritis, left elbow: Secondary | ICD-10-CM | POA: Diagnosis not present

## 2018-02-03 DIAGNOSIS — Z0001 Encounter for general adult medical examination with abnormal findings: Secondary | ICD-10-CM

## 2018-02-03 DIAGNOSIS — M19021 Primary osteoarthritis, right elbow: Secondary | ICD-10-CM

## 2018-02-03 DIAGNOSIS — E291 Testicular hypofunction: Secondary | ICD-10-CM | POA: Diagnosis not present
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
## 2018-02-18 ENCOUNTER — Other Ambulatory Visit: Payer: Self-pay | Admitting: Sports Medicine

## 2018-02-18 DIAGNOSIS — G8929 Other chronic pain: Secondary | ICD-10-CM

## 2018-02-18 DIAGNOSIS — M25521 Pain in right elbow: Secondary | ICD-10-CM

## 2018-02-18 DIAGNOSIS — M25522 Pain in left elbow: Principal | ICD-10-CM

## 2018-02-22 DIAGNOSIS — M19022 Primary osteoarthritis, left elbow: Principal | ICD-10-CM

## 2018-02-22 DIAGNOSIS — M5116 Intervertebral disc disorders with radiculopathy, lumbar region: Secondary | ICD-10-CM | POA: Insufficient documentation

## 2018-02-22 DIAGNOSIS — Z0001 Encounter for general adult medical examination with abnormal findings: Secondary | ICD-10-CM | POA: Insufficient documentation

## 2018-02-22 DIAGNOSIS — I10 Essential (primary) hypertension: Secondary | ICD-10-CM | POA: Insufficient documentation

## 2018-02-22 DIAGNOSIS — E291 Testicular hypofunction: Secondary | ICD-10-CM | POA: Insufficient documentation

## 2018-02-22 DIAGNOSIS — M19021 Primary osteoarthritis, right elbow: Secondary | ICD-10-CM | POA: Insufficient documentation

## 2018-02-27 ENCOUNTER — Ambulatory Visit: Payer: 59

## 2018-03-30 ENCOUNTER — Telehealth: Payer: Self-pay

## 2018-03-30 DIAGNOSIS — F5101 Primary insomnia: Secondary | ICD-10-CM

## 2018-03-30 MED ORDER — ZOLPIDEM TARTRATE 10 MG PO TABS: 10.0000 mg | ORAL_TABLET | Freq: Every evening | ORAL | 2 refills | Status: DC | PRN

## 2018-03-31 NOTE — Telephone Encounter (Signed)
Pt advised esend zolpidem to phar

## 2018-04-09 ENCOUNTER — Telehealth: Payer: Self-pay | Admitting: Nurse Practitioner

## 2018-04-09 ENCOUNTER — Other Ambulatory Visit: Payer: Self-pay | Admitting: Nurse Practitioner

## 2018-04-09 DIAGNOSIS — M064 Inflammatory polyarthropathy: Secondary | ICD-10-CM

## 2018-04-09 MED ORDER — PREDNISONE 10 MG (48) PO TBPK: ORAL_TABLET | ORAL | 0 refills | Status: DC

## 2018-04-09 NOTE — Progress Notes (Signed)
Sent in prednisone 12 day taper to his pharmacy.

## 2018-04-09 NOTE — Telephone Encounter (Signed)
Sent in prednisone 12 day taper to his pharmacy.

## 2018-05-27 ENCOUNTER — Other Ambulatory Visit: Payer: Self-pay

## 2018-05-27 DIAGNOSIS — M064 Inflammatory polyarthropathy: Secondary | ICD-10-CM

## 2018-05-27 MED ORDER — PREDNISONE 10 MG (48) PO TBPK: ORAL_TABLET | ORAL | 0 refills | Status: DC

## 2018-05-27 NOTE — Telephone Encounter (Signed)
Pt wife called pt had flare up in his knee again as per heather send prednisone

## 2018-06-04 ENCOUNTER — Encounter: Payer: Self-pay | Admitting: Nurse Practitioner

## 2018-06-04 ENCOUNTER — Ambulatory Visit: Payer: 59 | Admitting: Nurse Practitioner

## 2018-06-04 VITALS — BP 128/74 | HR 119 | Resp 16 | Ht 71.0 in | Wt 220.0 lb

## 2018-06-04 DIAGNOSIS — M25562 Pain in left knee: Secondary | ICD-10-CM | POA: Insufficient documentation

## 2018-06-04 DIAGNOSIS — R000 Tachycardia, unspecified: Secondary | ICD-10-CM

## 2018-06-04 DIAGNOSIS — F5101 Primary insomnia: Secondary | ICD-10-CM | POA: Diagnosis not present

## 2018-06-04 DIAGNOSIS — K219 Gastro-esophageal reflux disease without esophagitis: Secondary | ICD-10-CM | POA: Insufficient documentation

## 2018-06-04 DIAGNOSIS — L299 Pruritus, unspecified: Secondary | ICD-10-CM | POA: Diagnosis not present

## 2018-06-04 MED ORDER — ZOLPIDEM TARTRATE 10 MG PO TABS: 10.0000 mg | ORAL_TABLET | Freq: Every evening | ORAL | 2 refills | Status: DC | PRN

## 2018-06-04 MED ORDER — HYDROXYZINE HCL 25 MG PO TABS: 25.0000 mg | ORAL_TABLET | Freq: Three times a day (TID) | ORAL | 0 refills | Status: DC | PRN

## 2018-06-04 MED ORDER — ATENOLOL 25 MG PO TABS: 12.5000 mg | ORAL_TABLET | Freq: Every day | ORAL | 3 refills | Status: DC

## 2018-06-04 MED ORDER — OMEPRAZOLE 40 MG PO CPDR: 40.0000 mg | DELAYED_RELEASE_CAPSULE | Freq: Every day | ORAL | 3 refills | Status: DC

## 2018-06-04 NOTE — Progress Notes (Signed)
[facility] [address] [place], [place] [address]  Internal MEDICINE  Office Visit Note  Patient Name: Daniel Jimenez  [id number]  [id number]  Date of Service: 06/04/2018  Chief Complaint  Patient presents with  . Rash    over entire body. been going on for about a month  . Osteoarthritis    left knee pain.     The patient has had left knee pain starting about 2 to 3 months ago. Pain is most severe along the superior part of the knee. Will develop significant swelling, like a pocket of fluid, just above the knee cap. Hurts when he gets up and down from seated or standing position. Did recently take a prednisone taper due to pain in both elbows. This helped a lot with knee pain as well as generalized joint pain.  Has also noted generalized itching. No rash. No insect bites. No breaks in the skin or evidence of infection.    Pt is here for routine follow up.    Current Medication: Outpatient Encounter Medications as of 06/04/2018  Medication Sig  . omeprazole (PRILOSEC) 40 MG capsule Take 1 capsule (40 mg total) by mouth daily.  . [DISCONTINUED] omeprazole (PRILOSEC) 40 MG capsule Take 40 mg by mouth daily.  Marland Kitchen atenolol (TENORMIN) 25 MG tablet Take 0.5 tablets (12.5 mg total) by mouth daily.  . fluticasone (FLONASE) 50 MCG/ACT nasal spray U 2 SPRAYS IEN D  . hydrOXYzine (ATARAX/VISTARIL) 25 MG tablet Take 1 tablet (25 mg total) by mouth 3 (three) times daily as needed for itching.  . predniSONE (STERAPRED UNI-PAK 48 TAB) 10 MG (48) TBPK tablet 12 day taper - take as directed for 12 days. (Patient not taking: Reported on 06/04/2018)  . zolpidem (AMBIEN) 10 MG tablet Take 1 tablet (10 mg total) by mouth at bedtime as needed for sleep.  . [DISCONTINUED] zolpidem (AMBIEN) 10 MG tablet Take 1 tablet (10 mg total) by mouth at bedtime as needed for sleep.   No facility-administered encounter medications on file as of 06/04/2018.     Surgical History: Past Surgical History:   Procedure Laterality Date  . ELBOW SURGERY Right 2009    Medical History: Past Medical History:  Diagnosis Date  . Acid reflux   . Kidney stone     Family History: Family History  Problem Relation Age of Onset  . Diabetes Father     Social History   Socioeconomic History  . Marital status: Married    Spouse name: Not on file  . Number of children: Not on file  . Years of education: Not on file  . Highest education level: Not on file  Occupational History  . Not on file  Social Needs  . Financial resource strain: Not on file  . Food insecurity:    Worry: Not on file    Inability: Not on file  . Transportation needs:    Medical: Not on file    Non-medical: Not on file  Tobacco Use  . Smoking status: Former Smoker    Packs/day: 1.00    Types: Cigarettes  . Smokeless tobacco: Never Used  Substance and Sexual Activity  . Alcohol use: Yes    Comment: occasionally  . Drug use: No  . Sexual activity: Not on file  Lifestyle  . Physical activity:    Days per week: Not on file    Minutes per session: Not on file  . Stress: Not on file  Relationships  . Social connections:  Talks on phone: Not on file    Gets together: Not on file    Attends religious service: Not on file    Active member of club or organization: Not on file    Attends meetings of clubs or organizations: Not on file    Relationship status: Not on file  . Intimate partner violence:    Fear of current or ex partner: Not on file    Emotionally abused: Not on file    Physically abused: Not on file    Forced sexual activity: Not on file  Other Topics Concern  . Not on file  Social History Narrative  . Not on file      Review of Systems  Constitutional: Negative for activity change, chills, fatigue and unexpected weight change.  HENT: Negative for congestion, postnasal drip, rhinorrhea, sinus pressure, sneezing and sore throat.   Eyes: Negative.  Negative for redness.  Respiratory:  Negative for cough, chest tightness, shortness of breath and wheezing.   Cardiovascular: Negative for chest pain and palpitations.  Gastrointestinal: Negative for abdominal pain, constipation, diarrhea, nausea and vomiting.  Endocrine: Negative for cold intolerance, heat intolerance, polydipsia, polyphagia and polyuria.  Genitourinary: Negative.  Negative for dysuria and frequency.  Musculoskeletal: Positive for arthralgias. Negative for back pain, joint swelling and neck pain.       Pain in both elows and left knee. Improved after recent prednisone taper.   Skin: Negative for rash.       Generalized itching.   Allergic/Immunologic: Positive for environmental allergies.  Neurological: Positive for headaches. Negative for tremors and numbness.  Hematological: Negative for adenopathy. Does not bruise/bleed easily.  Psychiatric/Behavioral: Positive for sleep disturbance. Negative for behavioral problems (Depression) and suicidal ideas. The patient is not nervous/anxious.    Today's Vitals   06/04/18 1502  BP: (!) 145/88  Pulse: (!) 119  Resp: 16  SpO2: 99%  Weight: 220 lb (99.8 kg)  Height: 5\' 11"  (1.803 m)    Physical Exam  Constitutional: He is oriented to person, place, and time. He appears well-developed and well-nourished. No distress.  HENT:  Head: Normocephalic and atraumatic.  Nose: Nose normal.  Mouth/Throat: Oropharynx is clear and moist. No oropharyngeal exudate.  Eyes: Pupils are equal, round, and reactive to light. EOM are normal.  Neck: Normal range of motion. Neck supple. No JVD present. No tracheal deviation present.  Cardiovascular: Normal rate, regular rhythm and normal heart sounds. Exam reveals no gallop and no friction rub.  No murmur heard. Mild tachycardia.   Pulmonary/Chest: Effort normal and breath sounds normal. No respiratory distress. He has no wheezes. He has no rales. He exhibits no tenderness.  Abdominal: Soft. Bowel sounds are normal. There is no  tenderness.  Musculoskeletal:  There is moderate pain with some deformity in both elbows. Pain is worse with flexion and extension of the ebows. Pain most severe along the medial and lateral joints of the elbows.  Pain in left knee, along superior aspect of left knee. Mild swelling present. No crepitus with flexion or extension.   Lymphadenopathy:    He has no cervical adenopathy.  Neurological: He is alert and oriented to person, place, and time. No cranial nerve deficit.  Skin: Skin is warm and dry. He is not diaphoretic.  Psychiatric: He has a normal mood and affect. His behavior is normal. Judgment and thought content normal.  Nursing note and vitals reviewed.  Assessment/Plan: 1. Tachycardia Add atenolol 12.5mg  every evening. Increase water intake. Recheck in 6  weeks - atenolol (TENORMIN) 25 MG tablet; Take 0.5 tablets (12.5 mg total) by mouth daily.  Dispense: 30 tablet; Refill: 3  2. Acute pain of left knee Refer to orthopedics for further evaluation and treatment.  - Ambulatory referral to Orthopedic Surgery  3. Generalized pruritus Add hydroxyzine 25mg  up to twice daily if needed. Recommend use at night only. Also recommend OTC claritin during the day to alleviate symptoms.  - hydrOXYzine (ATARAX/VISTARIL) 25 MG tablet; Take 1 tablet (25 mg total) by mouth 3 (three) times daily as needed for itching.  Dispense: 45 tablet; Refill: 0  4. Primary insomnia May continue ambien 10mg  at bedtime as needed. Refill provided today.  - zolpidem (AMBIEN) 10 MG tablet; Take 1 tablet (10 mg total) by mouth at bedtime as needed for sleep.  Dispense: 30 tablet; Refill: 2  5. Gastroesophageal reflux disease without esophagitis - omeprazole (PRILOSEC) 40 MG capsule; Take 1 capsule (40 mg total) by mouth daily.  Dispense: 30 capsule; Refill: 3  General  Counseling: Daniel Jimenez understanding of the findings of todays visit and agrees with plan of treatment. I have discussed any further  diagnostic evaluation that may be needed or ordered today. We also reviewed his medications today. he has been encouraged to call the office with any questions or concerns that should arise related to todays visit.    Counseling:    Orders Placed This Encounter  Procedures  . Ambulatory referral to Orthopedic Surgery    Meds ordered this encounter  Medications  . atenolol (TENORMIN) 25 MG tablet    Sig: Take 0.5 tablets (12.5 mg total) by mouth daily.    Dispense:  30 tablet    Refill:  3    Order Specific Question:   Supervising Provider    Answer:   Lyndon Code [[id number]]  . hydrOXYzine (ATARAX/VISTARIL) 25 MG tablet    Sig: Take 1 tablet (25 mg total) by mouth 3 (three) times daily as needed for itching.    Dispense:  45 tablet    Refill:  0    Order Specific Question:   Supervising Provider    Answer:   Lyndon Code [[id number]]  . zolpidem (AMBIEN) 10 MG tablet    Sig: Take 1 tablet (10 mg total) by mouth at bedtime as needed for sleep.    Dispense:  30 tablet    Refill:  2    Order Specific Question:   Supervising Provider    Answer:   Lyndon Code [[id number]]  . omeprazole (PRILOSEC) 40 MG capsule    Sig: Take 1 capsule (40 mg total) by mouth daily.    Dispense:  30 capsule    Refill:  3    Order Specific Question:   Supervising Provider    Answer:   Lyndon Code [[id number]]    Time spent: 25 Minutes   This patient was seen by Vincent Gros, FNP- C in Collaboration with Dr Lyndon Code as a part of collaborative care agreement     Dr Lyndon Code Internal medicine

## 2018-07-17 ENCOUNTER — Ambulatory Visit: Payer: Self-pay | Admitting: Nurse Practitioner

## 2018-07-20 ENCOUNTER — Ambulatory Visit: Payer: 59 | Admitting: Adult Health

## 2018-07-20 ENCOUNTER — Encounter: Payer: Self-pay | Admitting: Adult Health

## 2018-07-20 VITALS — BP 128/82 | HR 105 | Resp 16 | Ht 71.0 in | Wt 217.6 lb

## 2018-07-20 DIAGNOSIS — K219 Gastro-esophageal reflux disease without esophagitis: Secondary | ICD-10-CM

## 2018-07-20 DIAGNOSIS — R0683 Snoring: Secondary | ICD-10-CM | POA: Diagnosis not present

## 2018-07-20 DIAGNOSIS — R000 Tachycardia, unspecified: Secondary | ICD-10-CM | POA: Diagnosis not present

## 2018-07-20 DIAGNOSIS — J0140 Acute pansinusitis, unspecified: Secondary | ICD-10-CM

## 2018-07-20 MED ORDER — ATENOLOL 25 MG PO TABS: 25.0000 mg | ORAL_TABLET | Freq: Every day | ORAL | 3 refills | Status: DC

## 2018-07-20 MED ORDER — AMOXICILLIN-POT CLAVULANATE 875-125 MG PO TABS: 1.0000 | ORAL_TABLET | Freq: Two times a day (BID) | ORAL | 0 refills | Status: DC

## 2018-07-20 NOTE — Progress Notes (Signed)
[facility] [address] [place], [place] [address]  Internal MEDICINE  Office Visit Note  Patient Name: Daniel ShipperMichael B Jimenez  [id number] 08, 1977  [id number]  Date of Service: 08/04/2018  Chief Complaint  Patient presents with  . Irregular Heart Beat  . Sinusitis    HPI Pt here for follow up for tachycardia.  He reports his Atenolol was added back to his medications at last visit.  His HR is down from 120's to 105 today. He is complaining today of sinus infection symptoms over the past week that has included sinus drainage, pressure, coughing and ear pain/fullness. He denies fever recently.  His main complaint today is head fullness.  He is using his Flonase as directed.    Current Medication: Outpatient Encounter Medications as of 07/20/2018  Medication Sig  . atenolol (TENORMIN) 25 MG tablet Take 1 tablet (25 mg total) by mouth daily.  . fluticasone (FLONASE) 50 MCG/ACT nasal spray U 2 SPRAYS IEN D  . hydrOXYzine (ATARAX/VISTARIL) 25 MG tablet Take 1 tablet (25 mg total) by mouth 3 (three) times daily as needed for itching.  Marland Kitchen. omeprazole (PRILOSEC) 40 MG capsule Take 1 capsule (40 mg total) by mouth daily.  . predniSONE (STERAPRED UNI-PAK 48 TAB) 10 MG (48) TBPK tablet 12 day taper - take as directed for 12 days.  . [DISCONTINUED] atenolol (TENORMIN) 25 MG tablet Take 0.5 tablets (12.5 mg total) by mouth daily.  Marland Kitchen. zolpidem (AMBIEN) 10 MG tablet Take 1 tablet (10 mg total) by mouth at bedtime as needed for sleep.  . [DISCONTINUED] amoxicillin-clavulanate (AUGMENTIN) 875-125 MG tablet Take 1 tablet by mouth 2 (two) times daily. (Patient not taking: Reported on 07/27/2018)   No facility-administered encounter medications on file as of 07/20/2018.     Surgical History: Past Surgical History:  Procedure Laterality Date  . ELBOW SURGERY Right 2009    Medical History: Past Medical History:  Diagnosis Date  . Acid reflux   . Kidney stone     Family History: Family History   Problem Relation Age of Onset  . Diabetes Father     Social History   Socioeconomic History  . Marital status: Married    Spouse name: Not on file  . Number of children: Not on file  . Years of education: Not on file  . Highest education level: Not on file  Occupational History  . Not on file  Social Needs  . Financial resource strain: Not on file  . Food insecurity:    Worry: Not on file    Inability: Not on file  . Transportation needs:    Medical: Not on file    Non-medical: Not on file  Tobacco Use  . Smoking status: Former Smoker    Packs/day: 1.00    Types: Cigarettes  . Smokeless tobacco: Never Used  Substance and Sexual Activity  . Alcohol use: Yes    Comment: occasionally  . Drug use: No  . Sexual activity: Not on file  Lifestyle  . Physical activity:    Days per week: Not on file    Minutes per session: Not on file  . Stress: Not on file  Relationships  . Social connections:    Talks on phone: Not on file    Gets together: Not on file    Attends religious service: Not on file    Active member of club or organization: Not on file    Attends meetings of clubs or organizations: Not on file  Relationship status: Not on file  . Intimate partner violence:    Fear of current or ex partner: Not on file    Emotionally abused: Not on file    Physically abused: Not on file    Forced sexual activity: Not on file  Other Topics Concern  . Not on file  Social History Narrative  . Not on file   Review of Systems  Constitutional: Negative.  Negative for chills, fatigue and unexpected weight change.  HENT: Positive for postnasal drip, sinus pressure and sinus pain. Negative for congestion, rhinorrhea, sneezing and sore throat.   Eyes: Negative for redness.  Respiratory: Negative.  Negative for cough, chest tightness and shortness of breath.   Cardiovascular: Negative.  Negative for chest pain and palpitations.  Gastrointestinal: Negative.  Negative for  abdominal pain, constipation, diarrhea, nausea and vomiting.  Endocrine: Negative.   Genitourinary: Negative.  Negative for dysuria and frequency.  Musculoskeletal: Negative.  Negative for arthralgias, back pain, joint swelling and neck pain.  Skin: Negative.  Negative for rash.  Allergic/Immunologic: Negative.   Neurological: Negative.  Negative for tremors and numbness.  Hematological: Negative for adenopathy. Does not bruise/bleed easily.  Psychiatric/Behavioral: Negative.  Negative for behavioral problems, sleep disturbance and suicidal ideas. The patient is not nervous/anxious.     Vital Signs: BP 128/82   Pulse (!) 105   Resp 16   Ht 5\' 11"  (1.803 m)   Wt 217 lb 9.6 oz (98.7 kg)   SpO2 99%   BMI 30.35 kg/m    Physical Exam  Constitutional: He is oriented to person, place, and time. He appears well-developed and well-nourished. No distress.  HENT:  Head: Normocephalic and atraumatic.  Mouth/Throat: Oropharynx is clear and moist. No oropharyngeal exudate.  Eyes: Pupils are equal, round, and reactive to light. EOM are normal.  Neck: Normal range of motion. Neck supple. No JVD present. No tracheal deviation present. No thyromegaly present.  Cardiovascular: Normal rate, regular rhythm and normal heart sounds. Exam reveals no gallop and no friction rub.  No murmur heard. Pulmonary/Chest: Effort normal and breath sounds normal. No respiratory distress. He has no wheezes. He has no rales. He exhibits no tenderness.  Abdominal: Soft. There is no tenderness. There is no guarding.  Musculoskeletal: Normal range of motion.  Lymphadenopathy:    He has no cervical adenopathy.  Neurological: He is alert and oriented to person, place, and time. No cranial nerve deficit.  Skin: Skin is warm and dry. He is not diaphoretic.  Psychiatric: He has a normal mood and affect. His behavior is normal. Judgment and thought content normal.  Nursing note and vitals reviewed.   Assessment/Plan: 1.  Acute non-recurrent pansinusitis Take medications as directed. - amoxicillin-clavulanate (AUGMENTIN) 875-125 MG tablet; Take 1 tablet by mouth 2 (two) times daily.  Dispense: 14 tablet; Refill: 0  2. Tachycardia Pt denies side effects,  HR still elevated at this time.    3. Gastroesophageal reflux disease without esophagitis Controlled on current regimen.  4. Snoring - Home sleep test  General Counseling: delmon andrada understanding of the findings of todays visit and agrees with plan of treatment. I have discussed any further diagnostic evaluation that may be needed or ordered today. We also reviewed his medications today. he has been encouraged to call the office with any questions or concerns that should arise related to todays visit.    Orders Placed This Encounter  Procedures  . Home sleep test    Meds ordered this encounter  Medications  . DISCONTD: amoxicillin-clavulanate (AUGMENTIN) 875-125 MG tablet    Sig: Take 1 tablet by mouth 2 (two) times daily.    Dispense:  14 tablet    Refill:  0  . atenolol (TENORMIN) 25 MG tablet    Sig: Take 1 tablet (25 mg total) by mouth daily.    Dispense:  30 tablet    Refill:  3    Time spent: 25 Minutes   This patient was seen by Blima LedgerAdam Lilybelle Mayeda AGNP-C in Collaboration with Dr Lyndon CodeFozia M Khan as a part of collaborative care agreement    Dr Lyndon CodeFozia M Khan Internal medicine

## 2018-07-20 NOTE — Patient Instructions (Signed)
Sinus Tachycardia °Sinus tachycardia is a kind of fast heartbeat. In sinus tachycardia, the heart beats more than 100 times a minute. Sinus tachycardia starts in a part of the heart called the sinus node. Sinus tachycardia may be harmless, or it may be a sign of a serious condition. °What are the causes? °This condition may be caused by: °· Exercise or exertion. °· A fever. °· Pain. °· Loss of body fluids (dehydration). °· Severe bleeding (hemorrhage). °· Anxiety and stress. °· Certain substances, including: °? Alcohol. °? Caffeine. °? Tobacco and nicotine products. °? Diet pills. °? Illegal drugs. °· Medical conditions including: °? Heart disease. °? An infection. °? An overactive thyroid (hyperthyroidism). °? A lack of red blood cells (anemia). ° °What are the signs or symptoms? °Symptoms of this condition include: °· A feeling that the heart is beating quickly (palpitations). °· Suddenly noticing your heartbeat (cardiac awareness). °· Dizziness. °· Tiredness (fatigue). °· Shortness of breath. °· Chest pain. °· Nausea. °· Fainting. ° °How is this diagnosed? °This condition is diagnosed with: °· A physical exam. °· Other tests, such as: °? Blood tests. °? An electrocardiogram (ECG). This test measures the electrical activity of the heart. °? Holter monitoring. For this test, you wear a device that records your heartbeat for one or more days. ° °You may be referred to a heart specialist (cardiologist). °How is this treated? °Treatment for this condition depends on the cause or underlying condition. Treatment may involve: °· Treating the underlying condition. °· Taking new medicines or changing your current medicines as told by your health care provider. °· Making changes to your diet or lifestyle. °· Practicing relaxation methods. ° °Follow these instructions at home: °Lifestyle °· Do not use any products that contain nicotine or tobacco, such as cigarettes and e-cigarettes. If you need help quitting, ask your  health care provider. °· Learn relaxation methods, like deep breathing, to help you when you get stressed or anxious. °· Do not use illegal drugs, such as cocaine. °· Do not abuse alcohol. Limit alcohol intake to no more than 1 drink a day for non-pregnant women and 2 drinks a day for men. One drink equals 12 oz of beer, 5 oz of wine, or 1½ oz of hard liquor. °· Find time to rest and relax often. This reduces stress. °· Avoid: °? Caffeine. °? Stimulants such as over-the-counter diet pills or pills that help you to stay awake. °? Situations that cause anxiety or stress. °General instructions °· Drink enough fluids to keep your urine clear or pale yellow. °· Take over-the-counter and prescription medicines only as told by your health care provider. °· Keep all follow-up visits as told by your health care provider. This is important. °Contact a health care provider if: °· You have a fever. °· You have vomiting or diarrhea that keeps happening (is persistent). °Get help right away if: °· You have pain in your chest, upper arms, jaw, or neck. °· You become weak or dizzy. °· You feel faint. °· You have palpitations that do not go away. °This information is not intended to replace advice given to you by your health care provider. Make sure you discuss any questions you have with your health care provider. °Document Released: 01/02/2005 Document Revised: 06/22/2016 Document Reviewed: 06/09/2015 °Elsevier Interactive Patient Education © 2018 Elsevier Inc. ° °

## 2018-07-21 ENCOUNTER — Other Ambulatory Visit: Payer: 59 | Admitting: Internal Medicine

## 2018-07-21 DIAGNOSIS — G4710 Hypersomnia, unspecified: Secondary | ICD-10-CM

## 2018-07-23 NOTE — Procedures (Signed)
[facility] [address] [place], [place] [address]  Sleep Specialist: Yevonne PaxSaadat A Braxtin Bamba, MD [facility]  Home Sleep Study Interpretation  Patient Name: Daniel ShipperMichael B Jimenez Patient MR [id number]:[id number] DOB:[birth year]  Date of Study: July 21, 2018  Indications for study: Hypersomnia  BMI: 30.2 kg/m       Respiratory Data:  Total AHI: 2.9/h respiratory disturbance index was 5.0/h  Total Obstructive Apneas: 0  Total Central Apneas: 0  Total Mixed Apneas: 0  Total Hypopneas: 15  If the AHI is greater than 5 per hour patient qualifies for PAP evaluation  Oximetry Data:  Oxygen Desaturation Index: 4.2  Lowest Desaturation: 82%  Cardiac Data:  Minimum Heart Rate: 58  Maximum Heart Rate: 102   Impression / Diagnosis:  This apnea study does not demonstrate significant obstructive sleep apnea based on the apnea hypopnea index.  Patient does have an elevated respiratory index of 5 so therefore clinical correlation is recommended.  In addition patient did have significant oxygen desaturations with the lowest saturation of 82%.  Patient may benefit from oxygen supplementing at nighttime clinical correlation is recommended  GENERAL Recommendations:  1.  Consider Auto PAP with pressure ranges 5-20 cmH20 with download, or facility based PAP Titration Study  2.  Consider PAP interface mask fitted for patient comfort, Heated Humidification & PAP compliance monitoring (1 month, 3 months & 12 months after PAP initiation)  3. Consider treatment with mandibular advancement splint (MAS) or referral to an ENT surgeon for modification to the upper airway if the patient prefers an alternate therapy or the PAP trial is unsuccessful  4. Sleep hygiene measures should be discussed with the patient  5. Behavioral therapy such as weight reduction or smoking cessation as appropriate for the patient  6. Advise patient against the use of alcohol or sedatives in so much as these  substances can worsen excessive daytime sleepiness and respiratory disturbances of sleep  7. Advise patient against participating in potentially dangerous activities while drowsy such as operating a motor vehicle, heavy equipment or power tools as it can put them and others in danger  8. Advise patient of the long term consequences of OSA if left untreated, need for treatment and close follow up  9. Clinical follow up as deemed necessary     This Level III home sleep study was performed using the [employer] ApneaLink Air, a 4 channel screening device subject to limitations. Depending on actual total sleep time, not measured in this study, the AHI (sum of apneas and hypopneas/hr of sleep) and therefore the severity of sleep apnea may be underestimated. As with any single night study, including Level 1 attended PSG, severity of sleep apnea may also be underestimated due to the lack of supine and/or REM sleep.  The interpretation associated with this report is based on normal values and degrees of severity in accordance with AASM parameters and/or estimated from multiple sources in the literature for adults ages 6920-80+. These may not agree with the displayed values. The patient's treating physician should use the interpretation and recommendations in conjunction with the overall clinical evaluation and treatment of the patient.  Some of the terminology used in this scored ApneaLink report was developed several years ago and may not always be in accordance with current nomenclature. This in no way affects the accuracy of the data or the reliability of the interpretation and recommendations.

## 2018-07-27 ENCOUNTER — Ambulatory Visit: Payer: 59 | Admitting: Internal Medicine

## 2018-07-27 ENCOUNTER — Encounter: Payer: Self-pay | Admitting: Internal Medicine

## 2018-07-27 VITALS — Resp 16 | Ht 71.0 in | Wt 219.0 lb

## 2018-07-27 DIAGNOSIS — R0902 Hypoxemia: Secondary | ICD-10-CM | POA: Diagnosis not present

## 2018-07-27 DIAGNOSIS — G4700 Insomnia, unspecified: Secondary | ICD-10-CM | POA: Diagnosis not present

## 2018-07-27 DIAGNOSIS — Z72821 Inadequate sleep hygiene: Secondary | ICD-10-CM | POA: Diagnosis not present

## 2018-07-27 NOTE — Progress Notes (Signed)
[facility] [address] [place], [place] [address]  Pulmonary Sleep Medicine   Office Visit Note  Patient Name: Daniel Jimenez DOB: 06/21/1976 MRN [id number]  Date of Service: 07/27/2018  Complaints/HPI: He has difficulty sleepsing at night. He states that he has been on ambien to help sleep. He states that  Now it is not helping as much. Patient had a sleep study done and this did not show OSA with an AHI of 2.9 per hour. Patient states he has tried [place] and this has not helped. He states he has mostly getting to sleep and once asleep he is OK. He states he cannot sleep if he wakes up at night. Patient states he is a active person. Does a lot of work around the house. He states after evening rituals he sits and watches TV. He states that he watches for about an hour. He states that he will lay I n bed and watch TV. He will take the ambien around 930pm. Even with this he has difficulty sleeping. He states he wakes up in am around 630AM without an alarm  ROS  General: (-) fever, (-) chills, (-) night sweats, (-) weakness Skin: (-) rashes, (-) itching,. Eyes: (-) visual changes, (-) redness, (-) itching. Nose and Sinuses: (-) nasal stuffiness or itchiness, (-) postnasal drip, (-) nosebleeds, (-) sinus trouble. Mouth and Throat: (-) sore throat, (-) hoarseness. Neck: (-) swollen glands, (-) enlarged thyroid, (-) neck pain. Respiratory: - cough, (-) bloody sputum, - shortness of breath, - wheezing. Cardiovascular: - ankle swelling, (-) chest pain. Lymphatic: (-) lymph node enlargement. Neurologic: (-) numbness, (-) tingling. Psychiatric: (-) anxiety, (-) depression   Current Medication: Outpatient Encounter Medications as of 07/27/2018  Medication Sig  . atenolol (TENORMIN) 25 MG tablet Take 1 tablet (25 mg total) by mouth daily.  . fluticasone (FLONASE) 50 MCG/ACT nasal spray U 2 SPRAYS IEN D  . hydrOXYzine (ATARAX/VISTARIL) 25 MG tablet Take 1 tablet (25 mg total)  by mouth 3 (three) times daily as needed for itching.  . predniSONE (STERAPRED UNI-PAK 48 TAB) 10 MG (48) TBPK tablet 12 day taper - take as directed for 12 days.  Marland Kitchen omeprazole (PRILOSEC) 40 MG capsule Take 1 capsule (40 mg total) by mouth daily.  Marland Kitchen zolpidem (AMBIEN) 10 MG tablet Take 1 tablet (10 mg total) by mouth at bedtime as needed for sleep.  . [DISCONTINUED] amoxicillin-clavulanate (AUGMENTIN) 875-125 MG tablet Take 1 tablet by mouth 2 (two) times daily. (Patient not taking: Reported on 07/27/2018)   No facility-administered encounter medications on file as of 07/27/2018.     Surgical History: Past Surgical History:  Procedure Laterality Date  . ELBOW SURGERY Right 2009    Medical History: Past Medical History:  Diagnosis Date  . Acid reflux   . Kidney stone     Family History: Family History  Problem Relation Age of Onset  . Diabetes Father     Social History: Social History   Socioeconomic History  . Marital status: Married    Spouse name: Not on file  . Number of children: Not on file  . Years of education: Not on file  . Highest education level: Not on file  Occupational History  . Not on file  Social Needs  . Financial resource strain: Not on file  . Food insecurity:    Worry: Not on file    Inability: Not on file  . Transportation needs:    Medical: Not on file  Non-medical: Not on file  Tobacco Use  . Smoking status: Former Smoker    Packs/day: 1.00    Types: Cigarettes  . Smokeless tobacco: Never Used  Substance and Sexual Activity  . Alcohol use: Yes    Comment: occasionally  . Drug use: No  . Sexual activity: Not on file  Lifestyle  . Physical activity:    Days per week: Not on file    Minutes per session: Not on file  . Stress: Not on file  Relationships  . Social connections:    Talks on phone: Not on file    Gets together: Not on file    Attends religious service: Not on file    Active member of club or organization: Not on file     Attends meetings of clubs or organizations: Not on file    Relationship status: Not on file  . Intimate partner violence:    Fear of current or ex partner: Not on file    Emotionally abused: Not on file    Physically abused: Not on file    Forced sexual activity: Not on file  Other Topics Concern  . Not on file  Social History Narrative  . Not on file    Vital Signs: Resp. rate 16, height 5\' 11"  (1.803 m), weight 219 lb (99.3 kg).  Examination: General Appearance: The patient is well-developed, well-nourished, and in no distress. Skin: Gross inspection of skin unremarkable. Head: normocephalic, no gross deformities. Eyes: no gross deformities noted. ENT: ears appear grossly normal no exudates. Neck: Supple. No thyromegaly. No LAD. Respiratory: no rhonchi noted. Cardiovascular: Normal S1 and S2 without murmur or rub. Extremities: No cyanosis. pulses are equal. Neurologic: Alert and oriented. No involuntary movements.  LABS: No results found for this or any previous visit (from the past 2160 hour(s)).  Radiology: Ct Renal Stone Study  Result Date: 05/01/2016 CLINICAL DATA:  Left flank pain, onset 3 hours prior. History of kidney stones. EXAM: CT ABDOMEN AND PELVIS WITHOUT CONTRAST TECHNIQUE: Multidetector CT imaging of the abdomen and pelvis was performed following the standard protocol without IV contrast. COMPARISON:  CT 05/19/2014 FINDINGS: Lower chest:  The included lung bases are clear. Liver: No focal lesion allowing for lack contrast. Hepatobiliary: Gallbladder physiologically distended, no calcified stone. No biliary dilatation. Pancreas: No ductal dilatation or inflammation. Spleen: Normal. Adrenal glands: No nodule. Kidneys: Obstructing 3 mm stone in the distal left ureter just proximal to the ureterovesicular junction with mild proximal hydroureteronephrosis and minimal perinephric stranding. Nonobstructing stones in the right kidney, no right hydronephrosis.  Stomach/Bowel: Stomach physiologically distended. There are no dilated or thickened small bowel loops. Small volume of stool throughout the colon without colonic wall thickening. The appendix is normal. Vascular/Lymphatic: No retroperitoneal adenopathy. Abdominal aorta is normal in caliber. Reproductive: Normal sized prostate gland. Bladder: Minimally distended, no bladder stone. Other: No free air, free fluid, or intra-abdominal fluid collection. Fat within both inguinal canals. Musculoskeletal: There are no acute or suspicious osseous abnormalities. Disc space narrowing at L5-S1. 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 IMPRESSION: 1. Obstructing 3 mm stone in the distal left ureter just proximal to the ureterovesicular junction with mild hydroureteronephrosis. Over 2 nonobstructing stones in the right kidney. 2. Avascular necrosis of both femoral heads, incidentally noted. Electronically Signed   By: Rubye OaksMelanie  Ehinger M.D.   On: 05/01/2016 05:56    No results found.  No results found.    Assessment and Plan: Patient Active Problem List  Diagnosis Date Noted  . Tachycardia 06/04/2018  . Acute pain of left knee 06/04/2018  . Generalized pruritus 06/04/2018  . Primary insomnia 06/04/2018  . Gastroesophageal reflux disease without esophagitis 06/04/2018  . Primary osteoarthritis of elbows, bilateral 02/22/2018  . Intervertebral disc disorder with radiculopathy of lumbar region 02/22/2018  . Testicular hypofunction 02/22/2018  . Essential hypertension 02/22/2018  . Encounter for general adult medical examination with abnormal findings 02/22/2018    1. Insomnia the main issue in this case appears to be more or less sleep hygiene.  We had a very long discussion about sleep hygiene and its importance.  Gave him a patient pamphlet for this.  As far as his sleep study is concerned he does not have significant sleep apnea at this time.  I do not think he requires  treatment with CPAP device. 2. Inadequate sleep hygiene discussed sleep hygiene at length with him.  Currently she states he is trying to go to sleep at 938 however he finds himself not getting to sleep until around midnight.  I have suggested that he not try to go to bed until he is actually tired.  He has been using Ambien as a sleep aid which I think for the short-term may be okay however I recommended that he eventually needs to wean himself off of the Ambien.  He appeared to understand and he will try to keep a log of his sleep times.  Also explained to him the concept of sleep pressure and he did understand that he needs to be actually tired for him to get to sleep. 3. Nocturnal hypoxia one thing that was noted on the sleep study is that he did have some oxygen saturation drops so therefore I recommended getting a overnight oximetry to confirm this.  If there is significant oxygen desaturation then he will need to have himself placed on oxygen.  General Counseling: I have discussed the findings of the evaluation and examination with Casimiro NeedleMichael.  I have also discussed any further diagnostic evaluation thatmay be needed or ordered today. Casimiro NeedleMichael verbalizes understanding of the findings of todays visit. We also reviewed his medications today and discussed drug interactions and side effects including but not limited excessive drowsiness and altered mental states. We also discussed that there is always a risk not just to him but also people around him. he has been encouraged to call the office with any questions or concerns that should arise related to todays visit.    Time spent: 60min  I have personally obtained a history, examined the patient, evaluated laboratory and imaging results, formulated the assessment and plan and placed orders.    Yevonne PaxSaadat A Khan, MD [facility] Pulmonary and Critical Care Sleep medicine

## 2018-07-27 NOTE — Patient Instructions (Signed)

## 2018-07-28 ENCOUNTER — Telehealth: Payer: Self-pay

## 2018-07-28 NOTE — Telephone Encounter (Signed)
[facility]

## 2018-07-29 ENCOUNTER — Other Ambulatory Visit: Payer: Self-pay | Admitting: Internal Medicine

## 2018-07-30 ENCOUNTER — Encounter: Payer: Self-pay | Admitting: Internal Medicine

## 2018-08-04 ENCOUNTER — Ambulatory Visit: Payer: Self-pay | Admitting: Internal Medicine

## 2018-09-08 ENCOUNTER — Telehealth: Payer: Self-pay

## 2018-09-08 NOTE — Telephone Encounter (Signed)
Patient was unable to do overnight pulse oximetry test due to no longer having active coverage with insurance. Daniel Jimenez

## 2018-09-11 ENCOUNTER — Other Ambulatory Visit: Payer: Self-pay | Admitting: Internal Medicine

## 2018-09-11 MED ORDER — FLUTICASONE PROPIONATE 50 MCG/ACT NA SUSP: NASAL | 5 refills | Status: DC

## 2018-10-06 ENCOUNTER — Ambulatory Visit: Payer: Self-pay | Admitting: Nurse Practitioner

## 2018-10-16 ENCOUNTER — Ambulatory Visit: Payer: Self-pay | Admitting: Adult Health

## 2018-10-16 ENCOUNTER — Encounter: Payer: Self-pay | Admitting: Adult Health

## 2018-10-16 VITALS — BP 132/86 | HR 103 | Temp 98.2°F | Resp 16 | Ht 71.0 in | Wt 222.0 lb

## 2018-10-16 DIAGNOSIS — M7122 Synovial cyst of popliteal space [Baker], left knee: Secondary | ICD-10-CM

## 2018-10-16 DIAGNOSIS — M25562 Pain in left knee: Secondary | ICD-10-CM

## 2018-10-16 MED ORDER — PREDNISONE 10 MG PO TABS: ORAL_TABLET | ORAL | 0 refills | Status: DC

## 2018-10-16 MED ORDER — OXYCODONE-ACETAMINOPHEN 5-325 MG PO TABS: 1.0000 | ORAL_TABLET | Freq: Three times a day (TID) | ORAL | 0 refills | Status: DC | PRN

## 2018-10-16 NOTE — Patient Instructions (Signed)
Baker Cyst A Baker cyst, also called a popliteal cyst, is a sac-like growth that forms at the back of the knee. The cyst forms when the fluid-filled sac (bursa) that cushions the knee joint becomes enlarged. The bursa that becomes a Baker cyst is located at the back of the knee joint. What are the causes? In most cases, a Baker cyst results from another knee problem that causes swelling inside the knee. This makes the fluid inside the knee joint (synovial fluid) flow into the bursa behind the knee, causing the bursa to enlarge. What increases the risk? You may be more likely to develop a Baker cyst if you already have a knee problem, such as:  A tear in cartilage that cushions the knee joint (meniscal tear).  A tear in the tissues that connect the bones of the knee joint (ligament tear).  Knee swelling from osteoarthritis, rheumatoid arthritis, or gout.  What are the signs or symptoms? A Baker cyst does not always cause symptoms. A lump behind the knee may be the only sign of the condition. The lump may be painful, especially when the knee is straightened. If the lump is painful, the pain may come and go. The knee may also be stiff. Symptoms may quickly get more severe if the cyst breaks open (ruptures). If your cyst ruptures, signs and symptoms may affect the knee and the back of the lower leg (calf) and may include:  Sudden or worsening pain.  Swelling.  Bruising.  How is this diagnosed? This condition may be diagnosed based on your symptoms and medical history. Your health care provider will also do a physical exam. This may include:  Feeling the cyst to check whether it is tender.  Checking your knee for signs of another knee condition that causes swelling.  You may have imaging tests, such as:  X-rays.  MRI.  Ultrasound.  How is this treated? A Baker cyst that is not painful may go away without treatment. If the cyst gets large or painful, it will likely get better if the  underlying knee problem is treated. Treatment for a Baker cyst may include:  Resting.  Keeping weight off of the knee. This means not leaning on the knee to support your body weight.  NSAIDs to reduce pain and swelling.  A procedure to drain the fluid from the cyst with a needle (aspiration). You may also get an injection of a medicine that reduces swelling (steroid).  Surgery. This may be needed if other treatments do not work. This usually involves correcting knee damage and removing the cyst.  Follow these instructions at home:  Take over-the-counter and prescription medicines only as told by your health care provider.  Rest and return to your normal activities as told by your health care provider. Avoid activities that make pain or swelling worse. Ask your health care provider what activities are safe for you.  Keep all follow-up visits as told by your health care provider. This is important. Contact a health care provider if:  You have knee pain, stiffness, or swelling that does not get better. Get help right away if:  You have sudden or worsening pain and swelling in your calf area. This information is not intended to replace advice given to you by your health care provider. Make sure you discuss any questions you have with your health care provider. Document Released: 11/25/2005 Document Revised: 08/15/2016 Document Reviewed: 08/15/2016 Elsevier Interactive Patient Education  2018 [employer]  

## 2018-10-16 NOTE — Progress Notes (Signed)
[facility] [address] [place], [place] [address]  Internal MEDICINE  Office Visit Note  Patient Name: Daniel Jimenez  [id number]  [id number]  Date of Service: 10/16/2018  Chief Complaint  Patient presents with  . Knee Pain    left knee      HPI Pt is here for a sick visit.  He reports left knee pain that has been going on for 6 weeks.  He reports he has seen ortho, and was awaiting results.  After he made this appointment, he was called by his doctor at [facility] and states he has a bakers cyst in his left knee.  He has intense pain, and decreased range of motion. Pt is here today requesting prednisone and pain medication to get him to his procedure to have the fluid removed.      Current Medication:  Outpatient Encounter Medications as of 10/16/2018  Medication Sig  . atenolol (TENORMIN) 25 MG tablet Take 1 tablet (25 mg total) by mouth daily.  . fluticasone (FLONASE) 50 MCG/ACT nasal spray U 2 SPRAYS IEN D  . hydrOXYzine (ATARAX/VISTARIL) 25 MG tablet Take 1 tablet (25 mg total) by mouth 3 (three) times daily as needed for itching.  Marland Kitchen omeprazole (PRILOSEC) 40 MG capsule Take 40 mg by mouth daily.  . [DISCONTINUED] omeprazole (PRILOSEC) 40 MG capsule Take 1 capsule (40 mg total) by mouth daily.  Marland Kitchen oxyCODONE-acetaminophen (PERCOCET) 5-325 MG tablet Take 1 tablet by mouth every 8 (eight) hours as needed for severe pain.  . predniSONE (DELTASONE) 10 MG tablet Use per dose pack  . zolpidem (AMBIEN) 10 MG tablet Take 1 tablet (10 mg total) by mouth at bedtime as needed for sleep.  . [DISCONTINUED] predniSONE (STERAPRED UNI-PAK 48 TAB) 10 MG (48) TBPK tablet 12 day taper - take as directed for 12 days. (Patient not taking: Reported on 10/16/2018)   No facility-administered encounter medications on file as of 10/16/2018.       Medical History: Past Medical History:  Diagnosis Date  . Acid reflux   . Kidney stone      Vital Signs: BP 132/86 (BP Location:  Left Arm, Patient Position: Sitting, Cuff Size: Normal)   Pulse (!) 103   Temp 98.2 F (36.8 C) (Oral)   Resp 16   Ht 5\' 11"  (1.803 m)   Wt 222 lb (100.7 kg)   SpO2 98%   BMI 30.96 kg/m    Review of Systems  Constitutional: Negative.  Negative for chills, fatigue and unexpected weight change.  HENT: Negative.  Negative for congestion, rhinorrhea, sneezing and sore throat.   Eyes: Negative for redness.  Respiratory: Negative.  Negative for cough, chest tightness and shortness of breath.   Cardiovascular: Negative.  Negative for chest pain and palpitations.  Gastrointestinal: Negative.  Negative for abdominal pain, constipation, diarrhea, nausea and vomiting.  Endocrine: Negative.   Genitourinary: Negative.  Negative for dysuria and frequency.  Musculoskeletal: Positive for joint swelling. Negative for arthralgias, back pain and neck pain.       Pain in left knee.   Skin: Negative.  Negative for rash.  Allergic/Immunologic: Negative.   Neurological: Negative.  Negative for tremors and numbness.  Hematological: Negative for adenopathy. Does not bruise/bleed easily.  Psychiatric/Behavioral: Negative.  Negative for behavioral problems, sleep disturbance and suicidal ideas. The patient is not nervous/anxious.     Physical Exam  Constitutional: He is oriented to person, place, and time. He appears well-developed and well-nourished. No distress.  HENT:  Head: Normocephalic and atraumatic.  Mouth/Throat: Oropharynx is clear and moist. No oropharyngeal exudate.  Eyes: Pupils are equal, round, and reactive to light. EOM are normal.  Neck: Normal range of motion. Neck supple. No JVD present. No tracheal deviation present. No thyromegaly present.  Cardiovascular: Normal rate, regular rhythm and normal heart sounds. Exam reveals no gallop and no friction rub.  No murmur heard. Pulmonary/Chest: Effort normal and breath sounds normal. No respiratory distress. He has no wheezes. He has no  rales. He exhibits no tenderness.  Abdominal: Soft. There is no tenderness. There is no guarding.  Musculoskeletal: Normal range of motion.  Decreased ROM in left knee. Pain in posterior knee, that radiates up posterior thigh.   Lymphadenopathy:    He has no cervical adenopathy.  Neurological: He is alert and oriented to person, place, and time. No cranial nerve deficit.  Skin: Skin is warm and dry. He is not diaphoretic.  Psychiatric: He has a normal mood and affect. His behavior is normal. Judgment and thought content normal.  Nursing note and vitals reviewed.  Assessment/Plan: 1. Acute pain of left knee Patient provided with oxycodone and prednisone to bridge him to his appointment to have his Baker's cyst drained. - predniSONE (DELTASONE) 10 MG tablet; Use per dose pack  Dispense: 21 tablet; Refill: 0 - oxyCODONE-acetaminophen (PERCOCET) 5-325 MG tablet; Take 1 tablet by mouth every 8 (eight) hours as needed for severe pain.  Dispense: 15 tablet; Refill: 0  2. Baker's cyst of knee, left Patient will have Baker's cyst drained he is awaiting an appointment from [facility] underarm.  General Counseling: ameet sandy understanding of the findings of todays visit and agrees with plan of treatment. I have discussed any further diagnostic evaluation that may be needed or ordered today. We also reviewed his medications today. he has been encouraged to call the office with any questions or concerns that should arise related to todays visit.   No orders of the defined types were placed in this encounter.   Meds ordered this encounter  Medications  . predniSONE (DELTASONE) 10 MG tablet    Sig: Use per dose pack    Dispense:  21 tablet    Refill:  0  . oxyCODONE-acetaminophen (PERCOCET) 5-325 MG tablet    Sig: Take 1 tablet by mouth every 8 (eight) hours as needed for severe pain.    Dispense:  15 tablet    Refill:  0    Time spent: 25 Minutes  This patient was seen by Blima Ledger AGNP-C in Collaboration with Dr Lyndon Code as a part of collaborative care agreement.  Johnna Acosta AGNP-C Internal Medicine

## 2018-10-27 ENCOUNTER — Ambulatory Visit: Payer: Self-pay | Admitting: Internal Medicine

## 2018-11-03 ENCOUNTER — Ambulatory Visit: Payer: Self-pay | Admitting: Internal Medicine

## 2018-11-13 ENCOUNTER — Other Ambulatory Visit: Payer: Self-pay

## 2018-11-13 DIAGNOSIS — F5101 Primary insomnia: Secondary | ICD-10-CM

## 2018-11-13 MED ORDER — ZOLPIDEM TARTRATE 10 MG PO TABS: ORAL_TABLET | ORAL | 0 refills | Status: DC

## 2018-11-13 NOTE — Telephone Encounter (Signed)
Called in phar for zolpidem 10 mg take 1 tab po at bedtime as needed for 10 days and pt advised need to been seen for further refills

## 2018-11-18 ENCOUNTER — Ambulatory Visit: Payer: Self-pay | Admitting: Adult Health

## 2018-11-18 ENCOUNTER — Encounter: Payer: Self-pay | Admitting: Adult Health

## 2018-11-18 VITALS — BP 122/96 | HR 105 | Resp 16 | Ht 71.0 in | Wt 221.0 lb

## 2018-11-18 DIAGNOSIS — I10 Essential (primary) hypertension: Secondary | ICD-10-CM

## 2018-11-18 DIAGNOSIS — F5101 Primary insomnia: Secondary | ICD-10-CM

## 2018-11-18 DIAGNOSIS — M7122 Synovial cyst of popliteal space [Baker], left knee: Secondary | ICD-10-CM

## 2018-11-18 MED ORDER — ZOLPIDEM TARTRATE 10 MG PO TABS: ORAL_TABLET | ORAL | 1 refills | Status: DC

## 2018-11-18 NOTE — Progress Notes (Signed)
[facility] [address] [place], [place] [id number]  Internal MEDICINE  Office Visit Note  Patient Name: Daniel Jimenez  [id number]  [id number]  Date of Service: 11/19/2018  Chief Complaint  Patient presents with  . Insomnia    medication refill     HPI Pt is here for follow up on knee pain, as well as insomnia.  Patient was seen by Ortho and had his Baker's cyst drained in the left knee.  Patient reports instant relief upon draining.  He states that he has noticed some clicking or popping noises in his knee at times since draining.  He reports the pain has completely resolved and he is very thankful for these results.  He continues to struggle with sleeping and requires Ambien nightly.  He is requesting a refill at this visit.    Current Medication: Outpatient Encounter Medications as of 11/18/2018  Medication Sig  . atenolol (TENORMIN) 25 MG tablet Take 1 tablet (25 mg total) by mouth daily.  Marland Kitchen. zolpidem (AMBIEN) 10 MG tablet Take 1 tab po at bedtime as needed for sleep  . [DISCONTINUED] zolpidem (AMBIEN) 10 MG tablet Take 1 tab po at bedtime as needed for sleep  . fluticasone (FLONASE) 50 MCG/ACT nasal spray U 2 SPRAYS IEN D (Patient not taking: Reported on 11/18/2018)  . hydrOXYzine (ATARAX/VISTARIL) 25 MG tablet Take 1 tablet (25 mg total) by mouth 3 (three) times daily as needed for itching. (Patient not taking: Reported on 11/18/2018)  . omeprazole (PRILOSEC) 40 MG capsule Take 40 mg by mouth daily.  Marland Kitchen. oxyCODONE-acetaminophen (PERCOCET) 5-325 MG tablet Take 1 tablet by mouth every 8 (eight) hours as needed for severe pain. (Patient not taking: Reported on 11/18/2018)  . predniSONE (DELTASONE) 10 MG tablet Use per dose pack (Patient not taking: Reported on 11/18/2018)   No facility-administered encounter medications on file as of 11/18/2018.     Surgical History: Past Surgical History:  Procedure Laterality Date  . ELBOW SURGERY Right 2009    Medical  History: Past Medical History:  Diagnosis Date  . Acid reflux   . Kidney stone     Family History: Family History  Problem Relation Age of Onset  . Diabetes Father     Social History   Socioeconomic History  . Marital status: Married    Spouse name: Not on file  . Number of children: Not on file  . Years of education: Not on file  . Highest education level: Not on file  Occupational History  . Not on file  Social Needs  . Financial resource strain: Not on file  . Food insecurity:    Worry: Not on file    Inability: Not on file  . Transportation needs:    Medical: Not on file    Non-medical: Not on file  Tobacco Use  . Smoking status: Former Smoker    Packs/day: 1.00    Types: Cigarettes  . Smokeless tobacco: Never Used  Substance and Sexual Activity  . Alcohol use: Yes    Comment: occasionally  . Drug use: No  . Sexual activity: Not on file  Lifestyle  . Physical activity:    Days per week: Not on file    Minutes per session: Not on file  . Stress: Not on file  Relationships  . Social connections:    Talks on phone: Not on file    Gets together: Not on file    Attends religious service: Not on file  Active member of club or organization: Not on file    Attends meetings of clubs or organizations: Not on file    Relationship status: Not on file  . Intimate partner violence:    Fear of current or ex partner: Not on file    Emotionally abused: Not on file    Physically abused: Not on file    Forced sexual activity: Not on file  Other Topics Concern  . Not on file  Social History Narrative  . Not on file      Review of Systems  Constitutional: Negative.  Negative for chills, fatigue and unexpected weight change.  HENT: Negative.  Negative for congestion, rhinorrhea, sneezing and sore throat.   Eyes: Negative for redness.  Respiratory: Negative.  Negative for cough, chest tightness and shortness of breath.   Cardiovascular: Negative.  Negative for  chest pain and palpitations.  Gastrointestinal: Negative.  Negative for abdominal pain, constipation, diarrhea, nausea and vomiting.  Endocrine: Negative.   Genitourinary: Negative.  Negative for dysuria and frequency.  Musculoskeletal: Negative.  Negative for arthralgias, back pain, joint swelling and neck pain.  Skin: Negative.  Negative for rash.  Allergic/Immunologic: Negative.   Neurological: Negative.  Negative for tremors and numbness.  Hematological: Negative for adenopathy. Does not bruise/bleed easily.  Psychiatric/Behavioral: Negative.  Negative for behavioral problems, sleep disturbance and suicidal ideas. The patient is not nervous/anxious.     Vital Signs: BP (!) 122/96   Pulse (!) 105   Resp 16   Ht 5\' 11"  (1.803 m)   Wt 221 lb (100.2 kg)   SpO2 98%   BMI 30.82 kg/m    Physical Exam  Constitutional: He is oriented to person, place, and time. He appears well-developed and well-nourished. No distress.  HENT:  Head: Normocephalic and atraumatic.  Mouth/Throat: Oropharynx is clear and moist. No oropharyngeal exudate.  Eyes: Pupils are equal, round, and reactive to light. EOM are normal.  Neck: Normal range of motion. Neck supple. No JVD present. No tracheal deviation present. No thyromegaly present.  Cardiovascular: Normal rate, regular rhythm and normal heart sounds. Exam reveals no gallop and no friction rub.  No murmur heard. Pulmonary/Chest: Effort normal and breath sounds normal. No respiratory distress. He has no wheezes. He has no rales. He exhibits no tenderness.  Abdominal: Soft. There is no tenderness. There is no guarding.  Musculoskeletal: Normal range of motion.  Lymphadenopathy:    He has no cervical adenopathy.  Neurological: He is alert and oriented to person, place, and time. No cranial nerve deficit.  Skin: Skin is warm and dry. He is not diaphoretic.  Psychiatric: He has a normal mood and affect. His behavior is normal. Judgment and thought content  normal.  Nursing note and vitals reviewed.  Assessment/Plan: 1. Baker's cyst of knee, left Resolved at this time.  Patient had cyst drained by orthopedics.  We will continue to monitor for future fluid collection.  2. Primary insomnia Patient is Ambien refilled at this time. - zolpidem (AMBIEN) 10 MG tablet; Take 1 tab po at bedtime as needed for sleep  Dispense: 30 tablet; Refill: 1 Reviewed risks and possible side effects associated with taking opiates, benzodiazepines and other CNS depressants. Combination of these could cause dizziness and drowsiness. Advised patient not to drive or operate machinery when taking these medications, as patient's and other's life can be at risk and will have consequences. Patient verbalized understanding in this matter. Dependence and abuse for these drugs will be monitored closely. A  Controlled substance policy and procedure is on file which allows [facility] to order a urine drug screen test at any visit. Patient understands and agrees with the plan  3. Essential hypertension Patient's blood pressure is slightly elevated diastolically.  He is also tachycardic.  Patient reports he was rushing to get to this appointment and he had denies any issues with this recently.  We will get a monitor at future visits.   General Counseling: jasmon graffam understanding of the findings of todays visit and agrees with plan of treatment. I have discussed any further diagnostic evaluation that may be needed or ordered today. We also reviewed his medications today. he has been encouraged to call the office with any questions or concerns that should arise related to todays visit.    No orders of the defined types were placed in this encounter.   Meds ordered this encounter  Medications  . zolpidem (AMBIEN) 10 MG tablet    Sig: Take 1 tab po at bedtime as needed for sleep    Dispense:  30 tablet    Refill:  1    Time spent: 25 Minutes   This patient  was seen by Blima Ledger AGNP-C in Collaboration with Dr Lyndon Code as a part of collaborative care agreement     Johnna Acosta AGNP-C Internal medicine

## 2018-11-18 NOTE — Patient Instructions (Signed)
Zolpidem tablets What is this medicine? ZOLPIDEM (zole PI dem) is used to treat insomnia. This medicine helps you to fall asleep and sleep through the night. This medicine may be used for other purposes; ask your health care provider or pharmacist if you have questions. COMMON BRAND NAME(S): Ambien What should I tell my health care provider before I take this medicine? They need to know if you have any of these conditions: -depression -history of drug abuse or addiction -if you often drink alcohol -liver disease -lung or breathing disease -myasthenia gravis -sleep apnea -suicidal thoughts, plans, or attempt; a previous suicide attempt by you or a family member -an unusual or allergic reaction to zolpidem, other medicines, foods, dyes, or preservatives -pregnant or trying to get pregnant -breast-feeding How should I use this medicine? Take this medicine by mouth with a glass of water. Follow the directions on the prescription label. It is better to take this medicine on an empty stomach and only when you are ready for bed. Do not take your medicine more often than directed. If you have been taking this medicine for several weeks and suddenly stop taking it, you may get unpleasant withdrawal symptoms. Your doctor or health care professional may want to gradually reduce the dose. Do not stop taking this medicine on your own. Always follow your doctor or health care professional's advice. A special MedGuide will be given to you by the pharmacist with each prescription and refill. Be sure to read this information carefully each time. Talk to your pediatrician regarding the use of this medicine in children. Special care may be needed. Overdosage: If you think you have taken too much of this medicine contact a poison control center or emergency room at once. NOTE: This medicine is only for you. Do not share this medicine with others. What if I miss a dose? This does not apply. This medicine should  only be taken immediately before going to sleep. Do not take double or extra doses. What may interact with this medicine? -alcohol -antihistamines for allergy, cough and cold -certain medicines for anxiety or sleep -certain medicines for depression, like amitriptyline, fluoxetine, sertraline -certain medicines for fungal infections like ketoconazole and itraconazole -certain medicines for seizures like phenobarbital, primidone -ciprofloxacin -dietary supplements for sleep, like valerian or kava kava -general anesthetics like halothane, isoflurane, methoxyflurane, propofol -local anesthetics like lidocaine, pramoxine, tetracaine -medicines that relax muscles for surgery -narcotic medicines for pain -phenothiazines like chlorpromazine, mesoridazine, prochlorperazine, thioridazine -rifampin This list may not describe all possible interactions. Give your health care provider a list of all the medicines, herbs, non-prescription drugs, or dietary supplements you use. Also tell them if you smoke, drink alcohol, or use illegal drugs. Some items may interact with your medicine. What should I watch for while using this medicine? Visit your doctor or health care professional for regular checks on your progress. Keep a regular sleep schedule by going to bed at about the same time each night. Avoid caffeine-containing drinks in the evening hours. When sleep medicines are used every night for more than a few weeks, they may stop working. Talk to your doctor if you still have trouble sleeping. After taking this medicine for sleep, you may get up out of bed while not being fully awake and do an activity that you do not know you are doing. The next morning, you may have no memory of the event. Activities such as driving a car ("sleep-driving"), making and eating food, talking on the phone, sexual activity,   and sleep-walking have been reported. Call your doctor right away if you find out you have done any of these  activities. Do not take this medicine if you have used alcohol that evening or before bed or taken another medicine for sleep since your risk of doing these sleep-related activities will be increased. Wait for at least 8 hours after you take a dose before driving or doing other activities that require full mental alertness. Do not take this medicine unless you are able to stay in bed for a full night (7 to 8 hours) before you must be active again. You may have a decrease in mental alertness the day after use, even if you feel that you are fully awake. Tell your doctor if you will need to perform activities requiring full alertness, such as driving, the next day. Do not stand or sit up quickly after taking this medicine, especially if you are an older patient. This reduces the risk of dizzy or fainting spells. If you or your family notice any changes in your behavior, such as new or worsening depression, thoughts of harming yourself, anxiety, other unusual or disturbing thoughts, or memory loss, call your doctor right away. After you stop taking this medicine, you may have trouble falling asleep. This is called rebound insomnia. This problem usually goes away on its own after 1 or 2 nights. What side effects may I notice from receiving this medicine? Side effects that you should report to your doctor or health care professional as soon as possible: -allergic reactions like skin rash, itching or hives, swelling of the face, lips, or tongue -breathing problems -changes in vision -confusion -depressed mood or other changes in moods or emotions -feeling faint or lightheaded, falls -hallucinations -loss of balance or coordination -loss of memory -numbness or tingling of the tongue -restlessness, excitability, or feelings of anxiety or agitation -signs and symptoms of liver injury like dark yellow or brown urine; general ill feeling or flu-like symptoms; light-colored stools; loss of appetite; nausea;  right upper belly pain; unusually weak or tired; yellowing of the eyes or skin -suicidal thoughts -unusual activities while asleep like driving, eating, making phone calls, or sexual activity Side effects that usually do not require medical attention (report to your doctor or health care professional if they continue or are bothersome): -dizziness -drowsiness the day after you take this medicine -headache This list may not describe all possible side effects. Call your doctor for medical advice about side effects. You may report side effects to FDA at 1-800-FDA-1088. Where should I keep my medicine? Keep out of the reach of children. This medicine can be abused. Keep your medicine in a safe place to protect it from theft. Do not share this medicine with anyone. Selling or giving away this medicine is dangerous and against the law. This medicine may cause accidental overdose and death if taken by other adults, children, or pets. Mix any unused medicine with a substance like cat litter or coffee grounds. Then throw the medicine away in a sealed container like a sealed bag or a coffee can with a lid. Do not use the medicine after the expiration date. Store at room temperature between 20 and 25 degrees C (68 and 77 degrees F). NOTE: This sheet is a summary. It may not cover all possible information. If you have questions about this medicine, talk to your doctor, pharmacist, or health care provider.  2018 Elsevier/Gold Standard (2016-02-28 14:38:20)  

## 2019-03-12 ENCOUNTER — Other Ambulatory Visit: Payer: Self-pay | Admitting: Adult Health

## 2019-03-12 DIAGNOSIS — M25562 Pain in left knee: Secondary | ICD-10-CM

## 2019-04-12 ENCOUNTER — Other Ambulatory Visit: Payer: Self-pay

## 2019-04-12 ENCOUNTER — Encounter: Payer: Self-pay | Admitting: Nurse Practitioner

## 2019-04-12 ENCOUNTER — Ambulatory Visit: Payer: Self-pay | Admitting: Nurse Practitioner

## 2019-04-12 VITALS — Ht 71.0 in | Wt 220.1 lb

## 2019-04-12 DIAGNOSIS — M064 Inflammatory polyarthropathy: Secondary | ICD-10-CM | POA: Insufficient documentation

## 2019-04-12 DIAGNOSIS — M5116 Intervertebral disc disorders with radiculopathy, lumbar region: Secondary | ICD-10-CM

## 2019-04-12 DIAGNOSIS — F5101 Primary insomnia: Secondary | ICD-10-CM

## 2019-04-12 MED ORDER — PREDNISONE 10 MG (48) PO TBPK: ORAL_TABLET | ORAL | 0 refills | Status: DC

## 2019-04-12 MED ORDER — ZOLPIDEM TARTRATE 10 MG PO TABS: ORAL_TABLET | ORAL | 3 refills | Status: DC

## 2019-04-12 NOTE — Progress Notes (Signed)
[facility] [address] [place], [place] [id number]  Internal MEDICINE  Telephone Visit  Patient Name: Daniel ShipperMichael B Jimenez  [id number]  [id number]  Date of Service: 04/27/2019  I connected with the patient at 3:35pm by webcam and verified the patients identity using two identifiers.   I discussed the limitations, risks, security and privacy concerns of performing an evaluation and management service by webcam and the availability of in person appointments. I also discussed with the patient that there may be a patient responsible charge related to the service.  The patient expressed understanding and agrees to proceed.    Chief Complaint  Patient presents with  . Telephone Assessment  . Telephone Screen  . Medication Refill  . Medical Management of Chronic Issues    joint pain    The patient has been contacted via webcam for follow up visit due to concerns for spread of novel coronavirus. The patient is c/o generalized joint pain. Will usually see massage therapy, but has not been able to go since end of February due to novel coronavirus. He has seen orthopedics and had cyst drained behind his knee. He is starting to have increased pain in the knees and legs, Orthopedics and rheumatologist. Both have recommended he see the same provider who did epidural injections for him in the past. May help with pain in back and legs. Would like to have new referral to go back and have new epidurals.  He also needs to have refills for his zolpidem.       Current Medication: Outpatient Encounter Medications as of 04/12/2019  Medication Sig  . atenolol (TENORMIN) 25 MG tablet Take 1 tablet (25 mg total) by mouth daily.  Marland Kitchen. omeprazole (PRILOSEC) 40 MG capsule Take 40 mg by mouth daily.  Marland Kitchen. zolpidem (AMBIEN) 10 MG tablet Take 1 tab po at bedtime as needed for sleep  . [DISCONTINUED] zolpidem (AMBIEN) 10 MG tablet Take 1 tab po at bedtime as needed for sleep  . predniSONE (STERAPRED UNI-PAK 48  TAB) 10 MG (48) TBPK tablet 12 day taper - take by mouth as directed for 12 days  . [DISCONTINUED] fluticasone (FLONASE) 50 MCG/ACT nasal spray U 2 SPRAYS IEN D (Patient not taking: Reported on 11/18/2018)  . [DISCONTINUED] hydrOXYzine (ATARAX/VISTARIL) 25 MG tablet Take 1 tablet (25 mg total) by mouth 3 (three) times daily as needed for itching. (Patient not taking: Reported on 11/18/2018)  . [DISCONTINUED] oxyCODONE-acetaminophen (PERCOCET) 5-325 MG tablet Take 1 tablet by mouth every 8 (eight) hours as needed for severe pain. (Patient not taking: Reported on 11/18/2018)  . [DISCONTINUED] predniSONE (DELTASONE) 10 MG tablet Use per dose pack (Patient not taking: Reported on 04/12/2019)   No facility-administered encounter medications on file as of 04/12/2019.     Surgical History: Past Surgical History:  Procedure Laterality Date  . ELBOW SURGERY Right 2009    Medical History: Past Medical History:  Diagnosis Date  . Acid reflux   . Kidney stone     Family History: Family History  Problem Relation Age of Onset  . Diabetes Father     Social History   Socioeconomic History  . Marital status: Married    Spouse name: Not on file  . Number of children: Not on file  . Years of education: Not on file  . Highest education level: Not on file  Occupational History  . Not on file  Social Needs  . Financial resource strain: Not on file  . Food insecurity:  Worry: Not on file    Inability: Not on file  . Transportation needs:    Medical: Not on file    Non-medical: Not on file  Tobacco Use  . Smoking status: Former Smoker    Packs/day: 1.00    Types: Cigarettes  . Smokeless tobacco: Never Used  Substance and Sexual Activity  . Alcohol use: Yes    Comment: occasionally  . Drug use: No  . Sexual activity: Not on file  Lifestyle  . Physical activity:    Days per week: Not on file    Minutes per session: Not on file  . Stress: Not on file  Relationships  . Social  connections:    Talks on phone: Not on file    Gets together: Not on file    Attends religious service: Not on file    Active member of club or organization: Not on file    Attends meetings of clubs or organizations: Not on file    Relationship status: Not on file  . Intimate partner violence:    Fear of current or ex partner: Not on file    Emotionally abused: Not on file    Physically abused: Not on file    Forced sexual activity: Not on file  Other Topics Concern  . Not on file  Social History Narrative  . Not on file      Review of Systems  Constitutional: Negative for activity change, chills, fatigue and unexpected weight change.  HENT: Negative for congestion, postnasal drip, rhinorrhea, sinus pressure, sneezing and sore throat.   Respiratory: Negative for cough, chest tightness, shortness of breath and wheezing.   Cardiovascular: Negative for chest pain and palpitations.  Endocrine: Negative for cold intolerance, heat intolerance, polydipsia and polyuria.  Musculoskeletal: Positive for arthralgias. Negative for back pain, joint swelling and neck pain.       Generalized joint and muscle pain.   Skin: Negative for rash.  Neurological: Positive for headaches. Negative for tremors and numbness.  Hematological: Negative for adenopathy. Does not bruise/bleed easily.  Psychiatric/Behavioral: Positive for sleep disturbance. Negative for behavioral problems (Depression) and suicidal ideas. The patient is not nervous/anxious.    Today's Vitals   04/12/19 1528  Weight: 220 lb 1.6 oz (99.8 kg)  Height: 5\' 11"  (1.803 m)   Body mass index is 30.7 kg/m.  Observation/Objective:  The patient is alert and oriented. He is pleasant and answering all questions appropriately. Breathing is non-labored. He is in no acute distress.    Assessment/Plan: 1. Inflammatory polyarthritis (HCC) Will do round prednisone taper to help reduce pain and inflammation. Take as directed for 12 days.   - predniSONE (STERAPRED UNI-PAK 48 TAB) 10 MG (48) TBPK tablet; 12 day taper - take by mouth as directed for 12 days  Dispense: 48 tablet; Refill: 0  2. Intervertebral disc disorder with radiculopathy of lumbar region Refer to orthopedics for further evaluation and treatment.  - Ambulatory referral to Orthopedic Surgery  3. Primary insomnia Ok to continue ambien 10mg  at bdetime as needed to reduce insomnia. New prescription sent to her pharmacy today.  - zolpidem (AMBIEN) 10 MG tablet; Take 1 tab po at bedtime as needed for sleep  Dispense: 30 tablet; Refill: 3  General Counseling: denise gmerek understanding of the findings of today's phone visit and agrees with plan of treatment. I have discussed any further diagnostic evaluation that may be needed or ordered today. We also reviewed his medications today. he has been encouraged to  call the office with any questions or concerns that should arise related to todays visit.  This patient was seen by Vincent Gros FNP Collaboration with Dr Lyndon Code as a part of collaborative care agreement  Orders Placed This Encounter  Procedures  . Ambulatory referral to Orthopedic Surgery    Meds ordered this encounter  Medications  . zolpidem (AMBIEN) 10 MG tablet    Sig: Take 1 tab po at bedtime as needed for sleep    Dispense:  30 tablet    Refill:  3    Order Specific Question:   Supervising Provider    Answer:   Lyndon Code [[id number]]  . predniSONE (STERAPRED UNI-PAK 48 TAB) 10 MG (48) TBPK tablet    Sig: 12 day taper - take by mouth as directed for 12 days    Dispense:  48 tablet    Refill:  0    Order Specific Question:   Supervising Provider    Answer:   Lyndon Code [[id number]]    Time spent: 68 Minutes    Dr Lyndon Code Internal medicine

## 2019-05-20 ENCOUNTER — Ambulatory Visit: Payer: Self-pay | Admitting: Adult Health

## 2019-08-24 ENCOUNTER — Telehealth: Payer: Self-pay

## 2019-08-24 NOTE — Telephone Encounter (Signed)
Denied pt need to been for [place] refills

## 2019-08-25 ENCOUNTER — Other Ambulatory Visit: Payer: Self-pay

## 2019-08-26 ENCOUNTER — Ambulatory Visit: Payer: Self-pay | Admitting: Nurse Practitioner

## 2019-08-26 ENCOUNTER — Other Ambulatory Visit: Payer: Self-pay

## 2019-08-26 ENCOUNTER — Encounter: Payer: Self-pay | Admitting: Nurse Practitioner

## 2019-08-26 VITALS — BP 128/81 | HR 81 | Temp 98.2°F | Resp 16 | Ht 71.0 in | Wt 223.0 lb

## 2019-08-26 DIAGNOSIS — N529 Male erectile dysfunction, unspecified: Secondary | ICD-10-CM

## 2019-08-26 DIAGNOSIS — L299 Pruritus, unspecified: Secondary | ICD-10-CM

## 2019-08-26 DIAGNOSIS — F5101 Primary insomnia: Secondary | ICD-10-CM

## 2019-08-26 DIAGNOSIS — M064 Inflammatory polyarthropathy: Secondary | ICD-10-CM

## 2019-08-26 DIAGNOSIS — E291 Testicular hypofunction: Secondary | ICD-10-CM

## 2019-08-26 MED ORDER — PREDNISONE 10 MG (48) PO TBPK: ORAL_TABLET | ORAL | 1 refills | Status: DC

## 2019-08-26 MED ORDER — HYDROXYZINE HCL 25 MG PO TABS: 25.0000 mg | ORAL_TABLET | Freq: Three times a day (TID) | ORAL | 3 refills | Status: DC | PRN

## 2019-08-26 MED ORDER — ZOLPIDEM TARTRATE 10 MG PO TABS: ORAL_TABLET | ORAL | 5 refills | Status: DC

## 2019-08-26 MED ORDER — SILDENAFIL CITRATE 50 MG PO TABS: ORAL_TABLET | ORAL | 3 refills | Status: DC

## 2019-08-26 NOTE — Progress Notes (Signed)
[facility] [address] [place], [place] [address]  Internal MEDICINE  Office Visit Note  Patient Name: Daniel Jimenez  [id number]  [id number]  Date of Service: 09/05/2019  Chief Complaint  Patient presents with  . Gastroesophageal Reflux  . Medication Management    pt needs itch medication? hydroxyzine??  . Knee Pain  . Medication Refill    ambien and itch med    The patient is here for routine follow up. Today, he complains of left knee pain which is intermittent. He has seen orthopedics and rheumatology. Has chronic inflammation. Does well with rest but gets worse again with exertion and quickly.  Erectile dysfunction. Has tried viagra several years ago.  Did not work. Saw urologist. Due to this and chronic testicular pain. No definitive diagnoses were given.       Current Medication: Outpatient Encounter Medications as of 08/26/2019  Medication Sig  . omeprazole (PRILOSEC) 40 MG capsule Take 40 mg by mouth daily.  Marland Kitchen zolpidem (AMBIEN) 10 MG tablet Take 1 tab po at bedtime as needed for sleep  . [DISCONTINUED] zolpidem (AMBIEN) 10 MG tablet Take 1 tab po at bedtime as needed for sleep  . hydrOXYzine (ATARAX/VISTARIL) 25 MG tablet Take 1 tablet (25 mg total) by mouth 3 (three) times daily as needed.  . predniSONE (STERAPRED UNI-PAK 48 TAB) 10 MG (48) TBPK tablet 12 day taper - take by mouth as directed for 12 days  . sildenafil (VIAGRA) 50 MG tablet Take 1 to 2 tablets po QD prn  . [DISCONTINUED] atenolol (TENORMIN) 25 MG tablet Take 1 tablet (25 mg total) by mouth daily. (Patient not taking: Reported on 08/26/2019)  . [DISCONTINUED] predniSONE (STERAPRED UNI-PAK 48 TAB) 10 MG (48) TBPK tablet 12 day taper - take by mouth as directed for 12 days (Patient not taking: Reported on 08/26/2019)   No facility-administered encounter medications on file as of 08/26/2019.     Surgical History: Past Surgical History:  Procedure Laterality Date  . ELBOW SURGERY Right  2009    Medical History: Past Medical History:  Diagnosis Date  . Acid reflux   . Kidney stone     Family History: Family History  Problem Relation Age of Onset  . Diabetes Father     Social History   Socioeconomic History  . Marital status: Married    Spouse name: Not on file  . Number of children: Not on file  . Years of education: Not on file  . Highest education level: Not on file  Occupational History  . Not on file  Social Needs  . Financial resource strain: Not on file  . Food insecurity    Worry: Not on file    Inability: Not on file  . Transportation needs    Medical: Not on file    Non-medical: Not on file  Tobacco Use  . Smoking status: Former Smoker    Packs/day: 1.00    Types: Cigarettes  . Smokeless tobacco: Never Used  Substance and Sexual Activity  . Alcohol use: Yes    Comment: occasionally  . Drug use: No  . Sexual activity: Not on file  Lifestyle  . Physical activity    Days per week: Not on file    Minutes per session: Not on file  . Stress: Not on file  Relationships  . Social Musician on phone: Not on file    Gets together: Not on file    Attends religious service:  Not on file    Active member of club or organization: Not on file    Attends meetings of clubs or organizations: Not on file    Relationship status: Not on file  . Intimate partner violence    Fear of current or ex partner: Not on file    Emotionally abused: Not on file    Physically abused: Not on file    Forced sexual activity: Not on file  Other Topics Concern  . Not on file  Social History Narrative  . Not on file      Review of Systems  Constitutional: Negative for activity change, chills, fatigue and unexpected weight change.  HENT: Negative for congestion, postnasal drip, rhinorrhea, sinus pressure, sneezing and sore throat.   Respiratory: Negative for cough, chest tightness, shortness of breath and wheezing.   Cardiovascular: Negative for  chest pain and palpitations.  Endocrine: Negative for cold intolerance, heat intolerance, polydipsia and polyuria.  Genitourinary:       Has had some issuew with erectile dysfunction.   Musculoskeletal: Positive for arthralgias. Negative for back pain, joint swelling and neck pain.       Generalized joint and muscle pain.   Skin: Negative for rash.  Neurological: Positive for headaches. Negative for tremors and numbness.  Hematological: Negative for adenopathy. Does not bruise/bleed easily.  Psychiatric/Behavioral: Positive for sleep disturbance. Negative for behavioral problems (Depression) and suicidal ideas. The patient is not nervous/anxious.     Today's Vitals   08/26/19 1053  BP: 128/81  Pulse: 81  Resp: 16  Temp: 98.2 F (36.8 C)  SpO2: 98%  Weight: 223 lb (101.2 kg)  Height: 5\' 11"  (1.803 m)   Body mass index is 31.1 kg/m.  Physical Exam Vitals signs and nursing note reviewed.  Constitutional:      General: He is not in acute distress.    Appearance: Normal appearance. He is well-developed. He is not diaphoretic.  HENT:     Head: Normocephalic and atraumatic.     Mouth/Throat:     Pharynx: No oropharyngeal exudate.  Eyes:     Pupils: Pupils are equal, round, and reactive to light.  Neck:     Musculoskeletal: Normal range of motion and neck supple.     Thyroid: No thyromegaly.     Vascular: No JVD.     Trachea: No tracheal deviation.  Cardiovascular:     Rate and Rhythm: Normal rate and regular rhythm.     Heart sounds: Normal heart sounds. No murmur. No friction rub. No gallop.   Pulmonary:     Effort: Pulmonary effort is normal. No respiratory distress.     Breath sounds: Normal breath sounds. No wheezing or rales.  Chest:     Chest wall: No tenderness.  Abdominal:     Palpations: Abdomen is soft.     Tenderness: There is no abdominal tenderness. There is no guarding.  Musculoskeletal: Normal range of motion.     Comments: Patient has generalized joint  pain, most severe in elbows, shoulders, and knees. Movement and exertion make this worse.   Lymphadenopathy:     Cervical: No cervical adenopathy.  Skin:    General: Skin is warm and dry.  Neurological:     Mental Status: He is alert and oriented to person, place, and time.     Cranial Nerves: No cranial nerve deficit.  Psychiatric:        Behavior: Behavior normal.        Thought Content: Thought  content normal.        Judgment: Judgment normal.    Assessment/Plan: 1. Inflammatory polyarthritis ([place]) Will start prednisone taper. Take as directed for 12 days. Recommended he see orthopedics for further concern about joint and muscle pain.  - predniSONE (STERAPRED UNI-PAK 48 TAB) 10 MG (48) TBPK tablet; 12 day taper - take by mouth as directed for 12 days  Dispense: 48 tablet; Refill: 1  2. Generalized pruritus May take atarax as needed and as prescribed for generalized itching.  - hydrOXYzine (ATARAX/VISTARIL) 25 MG tablet; Take 1 tablet (25 mg total) by mouth 3 (three) times daily as needed.  Dispense: 45 tablet; Refill: 3  3. Testicular hypofunction New prescription for Viagra 50mg  taking one to two tablets as needed. Recommend he see new urologist if problems persist.   4. Erectile dysfunction, unspecified erectile dysfunction type New prescription for Viagra 50mg  taking one to two tablets as needed. Recommend he see new urologist if problems persist.  - sildenafil (VIAGRA) 50 MG tablet; Take 1 to 2 tablets po QD prn  Dispense: 20 tablet; Refill: 3  5. Primary insomnia Renew ambien. Take at bedtime as needed. New prescription sent to his pharmacy.  - zolpidem (AMBIEN) 10 MG tablet; Take 1 tab po at bedtime as needed for sleep  Dispense: 30 tablet; Refill: 5  General Counseling: nirvan laban understanding of the findings of todays visit and agrees with plan of treatment. I have discussed any further diagnostic evaluation that may be needed or ordered today. We also reviewed  his medications today. he has been encouraged to call the office with any questions or concerns that should arise related to todays visit.  This patient was seen by [facility] with Dr Daniel Guise as a part of collaborative care agreement  Meds ordered this encounter  Medications  . zolpidem (AMBIEN) 10 MG tablet    Sig: Take 1 tab po at bedtime as needed for sleep    Dispense:  30 tablet    Refill:  5    Order Specific Question:   Supervising Provider    Answer:   Daniel Jimenez  . hydrOXYzine (ATARAX/VISTARIL) 25 MG tablet    Sig: Take 1 tablet (25 mg total) by mouth 3 (three) times daily as needed.    Dispense:  45 tablet    Refill:  3    Order Specific Question:   Supervising Provider    Answer:   Daniel Guise [[id number]]  . predniSONE (STERAPRED UNI-PAK 48 TAB) 10 MG (48) TBPK tablet    Sig: 12 day taper - take by mouth as directed for 12 days    Dispense:  48 tablet    Refill:  1    Order Specific Question:   Supervising Provider    Answer:   Daniel Jimenez  . sildenafil (VIAGRA) 50 MG tablet    Sig: Take 1 to 2 tablets po QD prn    Dispense:  20 tablet    Refill:  3    Order Specific Question:   Supervising Provider    Answer:   Daniel Guise [[id number]]    Time spent: 60 Minutes      Dr Daniel Guise Internal medicine

## 2019-09-05 DIAGNOSIS — N529 Male erectile dysfunction, unspecified: Secondary | ICD-10-CM | POA: Insufficient documentation

## 2020-02-24 ENCOUNTER — Telehealth: Payer: Self-pay

## 2020-02-24 NOTE — Telephone Encounter (Signed)
CONFIRMED AND SCREENED FOR 02-28-20 OV. 

## 2020-02-28 ENCOUNTER — Other Ambulatory Visit: Payer: Self-pay

## 2020-02-28 ENCOUNTER — Ambulatory Visit: Payer: Self-pay | Admitting: Nurse Practitioner

## 2020-02-28 ENCOUNTER — Encounter: Payer: Self-pay | Admitting: Nurse Practitioner

## 2020-02-28 VITALS — BP 150/90 | HR 116 | Temp 98.4°F | Resp 16 | Ht 71.0 in | Wt 230.2 lb

## 2020-02-28 DIAGNOSIS — Z0001 Encounter for general adult medical examination with abnormal findings: Secondary | ICD-10-CM

## 2020-02-28 DIAGNOSIS — R000 Tachycardia, unspecified: Secondary | ICD-10-CM

## 2020-02-28 DIAGNOSIS — L299 Pruritus, unspecified: Secondary | ICD-10-CM

## 2020-02-28 DIAGNOSIS — F5101 Primary insomnia: Secondary | ICD-10-CM

## 2020-02-28 MED ORDER — ZOLPIDEM TARTRATE 10 MG PO TABS: ORAL_TABLET | ORAL | 5 refills | Status: DC

## 2020-02-28 MED ORDER — METOPROLOL TARTRATE 25 MG PO TABS: ORAL_TABLET | ORAL | 3 refills | Status: DC

## 2020-02-28 MED ORDER — HYDROXYZINE HCL 25 MG PO TABS: 25.0000 mg | ORAL_TABLET | Freq: Three times a day (TID) | ORAL | 3 refills | Status: DC | PRN

## 2020-02-28 NOTE — Progress Notes (Signed)
[facility] [place], [place] [address]  Internal MEDICINE  Office Visit Note  Patient Name: Daniel Jimenez  [id number]  [id number]  Date of Service: 03/08/2020   Pt is here for routine health maintenance examination  Chief Complaint  Patient presents with  . Annual Exam  . Gastroesophageal Reflux     The patient is here for health maintenance exam. Blood pressure and heart rate are blood pressure are both mildly elevated today. He has had this in the past. Had to be on atenolol in the past. Did help his blood pressure and heart rate. Has not had this in some time, but had been well managed for a while without medication.  Complains about generalized itching. Will sometimes take hydroxyzine to help with itching. Makes him very sleepy. Only takes as needed. Used to take claritin D to help with allergies. Able to use nasal spray now and has good control of allergy symptoms.  Needs to have routine, fasting labs done. He has not had this for some time.     Current Medication: Outpatient Encounter Medications as of 02/28/2020  Medication Sig  . hydrOXYzine (ATARAX/VISTARIL) 25 MG tablet Take 1 tablet (25 mg total) by mouth 3 (three) times daily as needed.  Marland Kitchen omeprazole (PRILOSEC) 40 MG capsule Take 40 mg by mouth daily.  . sildenafil (VIAGRA) 50 MG tablet Take 1 to 2 tablets po QD prn  . zolpidem (AMBIEN) 10 MG tablet Take 1 tab po at bedtime as needed for sleep  . [DISCONTINUED] hydrOXYzine (ATARAX/VISTARIL) 25 MG tablet Take 1 tablet (25 mg total) by mouth 3 (three) times daily as needed.  . [DISCONTINUED] zolpidem (AMBIEN) 10 MG tablet Take 1 tab po at bedtime as needed for sleep  . metoprolol tartrate (LOPRESSOR) 25 MG tablet Take 1/2 tablet po BID  . [DISCONTINUED] predniSONE (STERAPRED UNI-PAK 48 TAB) 10 MG (48) TBPK tablet 12 day taper - take by mouth as directed for 12 days (Patient not taking: Reported on 02/28/2020)   No facility-administered  encounter medications on file as of 02/28/2020.    Surgical History: Past Surgical History:  Procedure Laterality Date  . ELBOW SURGERY Right 2009    Medical History: Past Medical History:  Diagnosis Date  . Acid reflux   . Kidney stone     Family History: Family History  Problem Relation Age of Onset  . Diabetes Father       Review of Systems  Constitutional: Negative for chills, fatigue and unexpected weight change.  HENT: Positive for congestion and postnasal drip. Negative for rhinorrhea, sneezing and sore throat.   Respiratory: Negative for cough, chest tightness, shortness of breath and wheezing.   Cardiovascular: Negative for chest pain and palpitations.       Elevated heart rate and blood pressure today.   Gastrointestinal: Negative for abdominal pain, constipation, diarrhea, nausea and vomiting.  Endocrine: Negative for cold intolerance, heat intolerance, polydipsia and polyuria.  Genitourinary: Negative for dysuria and frequency.  Musculoskeletal: Positive for arthralgias, back pain, myalgias and neck pain. Negative for joint swelling.  Skin: Negative for rash.  Allergic/Immunologic: Positive for environmental allergies.  Neurological: Negative for dizziness, tremors, numbness and headaches.  Hematological: Negative for adenopathy. Does not bruise/bleed easily.  Psychiatric/Behavioral: Positive for sleep disturbance. Negative for behavioral problems (Depression) and suicidal ideas. The patient is not nervous/anxious.      Today's Vitals   02/28/20 1523  BP: (!) 150/90  Pulse: (!) 116  Resp: 16  Temp:  98.4 F (36.9 C)  SpO2: 97%  Weight: 230 lb 3.2 oz (104.4 kg)  Height: 5\' 11"  (1.803 m)   Body mass index is 32.11 kg/m.  Physical Exam Vitals and nursing note reviewed.  Constitutional:      General: He is not in acute distress.    Appearance: Normal appearance. He is well-developed. He is not diaphoretic.  HENT:     Head: Normocephalic and  atraumatic.     Nose: Nose normal.     Mouth/Throat:     Pharynx: No oropharyngeal exudate.  Eyes:     Pupils: Pupils are equal, round, and reactive to light.  Neck:     Thyroid: No thyromegaly.     Vascular: No carotid bruit or JVD.     Trachea: No tracheal deviation.  Cardiovascular:     Rate and Rhythm: Regular rhythm. Tachycardia present.     Pulses: Normal pulses.     Heart sounds: Normal heart sounds. No murmur. No friction rub. No gallop.   Pulmonary:     Effort: Pulmonary effort is normal. No respiratory distress.     Breath sounds: Normal breath sounds. No wheezing or rales.  Chest:     Chest wall: No tenderness.  Abdominal:     General: Bowel sounds are normal.     Palpations: Abdomen is soft.     Tenderness: There is no abdominal tenderness. There is no guarding.  Musculoskeletal:        General: Normal range of motion.     Cervical back: Normal range of motion and neck supple.     Comments: Patient has generalized joint pain, most severe in elbows, shoulders, and knees. Movement and exertion make this worse.   Lymphadenopathy:     Cervical: No cervical adenopathy.  Skin:    General: Skin is warm and dry.  Neurological:     Mental Status: He is alert and oriented to person, place, and time.     Cranial Nerves: No cranial nerve deficit.  Psychiatric:        Mood and Affect: Mood normal.        Behavior: Behavior normal.        Thought Content: Thought content normal.        Judgment: Judgment normal.      LABS: Recent Results (from the past 2160 hour(s))  CBC     Status: None   Collection Time: 02/29/20  3:10 PM  Result Value Ref Range   WBC 10.4 3.4 - 10.8 x10E3/uL   RBC 5.13 4.14 - 5.80 x10E6/uL   Hemoglobin 15.5 13.0 - 17.7 g/dL   Hematocrit 03/02/20 83.4 - 51.0 %   MCV 89 79 - 97 fL   MCH 30.2 26.6 - 33.0 pg   MCHC 33.9 31.5 - 35.7 g/dL   RDW 19.6 22.2 - 97.9 %   Platelets 272 150 - 450 x10E3/uL  Basic metabolic panel     Status: None   Collection  Time: 02/29/20  3:10 PM  Result Value Ref Range   Glucose 83 65 - 99 mg/dL   BUN 10 6 - 24 mg/dL   Creatinine, Ser 03/02/20 0.76 - 1.27 mg/dL   GFR calc non Af Amer 107 >59 mL/min/1.73   GFR calc Af Amer 124 >59 mL/min/1.73   BUN/Creatinine Ratio 12 9 - 20   Sodium 141 134 - 144 mmol/L   Potassium 4.2 3.5 - 5.2 mmol/L   Chloride 100 96 - 106 mmol/L   CO2 27  20 - 29 mmol/L   Calcium 9.8 8.7 - 10.2 mg/dL  Lipid Panel w/o Chol/HDL Ratio     Status: Abnormal   Collection Time: 02/29/20  3:10 PM  Result Value Ref Range   Cholesterol, Total 177 100 - 199 mg/dL   Triglycerides 932 (H) 0 - 149 mg/dL   HDL 37 (L) >35 mg/dL   VLDL Cholesterol Cal 34 5 - 40 mg/dL   LDL Chol Calc (NIH) 573 (H) 0 - 99 mg/dL  Hgb U2G w/o eAG     Status: None   Collection Time: 02/29/20  3:10 PM  Result Value Ref Range   Hgb A1c MFr Bld 5.5 4.8 - 5.6 %    Comment:          Prediabetes: 5.7 - 6.4          Diabetes: >6.4          Glycemic control for adults with diabetes: <7.0   TSH     Status: None   Collection Time: 02/29/20  3:10 PM  Result Value Ref Range   TSH 2.350 0.450 - 4.500 uIU/mL    Assessment/Plan: 1. Encounter for general adult medical examination with abnormal findings Annual health maintenance exam today. Lab slip given to check routine, fasting labs .  2. Tachycardia Start metoprolol 25mg  - take 1/2 tablet twice daily. moniktor blood pressure and heart rate closely at home and notify office if any trouble with new medication.  - metoprolol tartrate (LOPRESSOR) 25 MG tablet; Take 1/2 tablet po BID  Dispense: 30 tablet; Refill: 3  3. Primary insomnia May take ambien 10mg  at bedtime as needed for insomnia. New prescription sent to his pharmacy today.  - zolpidem (AMBIEN) 10 MG tablet; Take 1 tab po at bedtime as needed for sleep  Dispense: 30 tablet; Refill: 5  4. Generalized pruritus May use hydroxyzine 25mg  up to three times daily as needed.  - hydrOXYzine (ATARAX/VISTARIL) 25 MG tablet;  Take 1 tablet (25 mg total) by mouth 3 (three) times daily as needed.  Dispense: 45 tablet; Refill: 3  General Counseling: osaze hubbert understanding of the findings of todays visit and agrees with plan of treatment. I have discussed any further diagnostic evaluation that may be needed or ordered today. We also reviewed his medications today. he has been encouraged to call the office with any questions or concerns that should arise related to todays visit.    Counseling:   Hypertension Counseling:   The following hypertensive lifestyle modification were recommended and discussed:  1. Limiting alcohol intake to less than 1 oz/day of ethanol:(24 oz of beer or 8 oz of wine or 2 oz of 100-proof whiskey). 2. Take baby ASA 81 mg daily. 3. Importance of regular aerobic exercise and losing weight. 4. Reduce dietary saturated fat and cholesterol intake for overall cardiovascular health. 5. Maintaining adequate dietary potassium, calcium, and magnesium intake. 6. Regular monitoring of the blood pressure. 7. Reduce sodium intake to less than 100 mmol/day (less than 2.3 gm of sodium or less than 6 gm of sodium choride)   This patient was seen by FNP Collaboration with Dr as a part of collaborative care agreement  Meds ordered this encounter  Medications  . zolpidem (AMBIEN) 10 MG tablet    Sig: Take 1 tab po at bedtime as needed for sleep    Dispense:  30 tablet    Refill:  5    Order Specific Question:   Supervising  Provider    Answer:   Lyndon Code [[id number]]  . hydrOXYzine (ATARAX/VISTARIL) 25 MG tablet    Sig: Take 1 tablet (25 mg total) by mouth 3 (three) times daily as needed.    Dispense:  45 tablet    Refill:  3    Order Specific Question:   Supervising Provider    Answer:   Lyndon Code [[id number]]  . metoprolol tartrate (LOPRESSOR) 25 MG tablet    Sig: Take 1/2 tablet po BID    Dispense:  30 tablet    Refill:  3    Order Specific Question:    Supervising Provider    Answer:   Lyndon Code [[id number]]    Total time spent: 30 Minutes  Time spent includes review of chart, medications, test results, and follow up plan with the patient.     Lyndon Code, MD  Internal Medicine

## 2020-02-29 ENCOUNTER — Other Ambulatory Visit: Payer: Self-pay | Admitting: Nurse Practitioner

## 2020-03-01 LAB — BASIC METABOLIC PANEL
BUN/Creatinine Ratio: 12 (ref 9–20)
BUN: 10 mg/dL (ref 6–24)
CO2: 27 mmol/L (ref 20–29)
Calcium: 9.8 mg/dL (ref 8.7–10.2)
Chloride: 100 mmol/L (ref 96–106)
Creatinine, Ser: 0.86 mg/dL (ref 0.76–1.27)
GFR calc Af Amer: 124 mL/min/{1.73_m2} (ref >59)
GFR calc non Af Amer: 107 mL/min/{1.73_m2} (ref >59)
Glucose: 83 mg/dL (ref 65–99)
Potassium: 4.2 mmol/L (ref 3.5–5.2)
Sodium: 141 mmol/L (ref 134–144)

## 2020-03-01 LAB — CBC
Hematocrit: 45.7 % (ref 37.5–51.0)
Hemoglobin: 15.5 g/dL (ref 13.0–17.7)
MCH: 30.2 pg (ref 26.6–33.0)
MCHC: 33.9 g/dL (ref 31.5–35.7)
MCV: 89 fL (ref 79–97)
Platelets: 272 10*3/uL (ref 150–450)
RBC: 5.13 x10E6/uL (ref 4.14–5.80)
RDW: 12.9 % (ref 11.6–15.4)
WBC: 10.4 10*3/uL (ref 3.4–10.8)

## 2020-03-01 LAB — LIPID PANEL W/O CHOL/HDL RATIO
Cholesterol, Total: 177 mg/dL (ref 100–199)
HDL: 37 mg/dL — ABNORMAL LOW (ref >39)
LDL Chol Calc (NIH): 106 mg/dL — ABNORMAL HIGH (ref 0–99)
Triglycerides: 198 mg/dL — ABNORMAL HIGH (ref 0–149)
VLDL Cholesterol Cal: 34 mg/dL (ref 5–40)

## 2020-03-01 LAB — HGB A1C W/O EAG: Hgb A1c MFr Bld: 5.5 % (ref 4.8–5.6)

## 2020-03-01 LAB — TSH: TSH: 2.35 u[IU]/mL (ref 0.450–4.500)

## 2020-03-02 NOTE — Progress Notes (Signed)
Please let the patient know that labs show very mildly elevated cholesterol. He should limit fried and fatty foods in his diet and continue to be physically active. All other labs were good. Thanks.

## 2020-03-03 ENCOUNTER — Telehealth: Payer: Self-pay

## 2020-03-03 NOTE — Telephone Encounter (Signed)
-----   Message from Carlean Jews, NP sent at 03/02/2020  8:42 PM EDT ----- Please let the patient know that labs show very mildly elevated cholesterol. He should limit fried and fatty foods in his diet and continue to be physically active. All other labs were good. Thanks.

## 2020-03-03 NOTE — Telephone Encounter (Signed)
Pt was notified.  

## 2020-03-15 ENCOUNTER — Telehealth: Payer: Self-pay

## 2020-03-15 NOTE — Telephone Encounter (Signed)
Patient rescheduled appointment on 06/29/2020 to 07/10/21.

## 2020-06-29 ENCOUNTER — Ambulatory Visit: Payer: Self-pay | Admitting: Nurse Practitioner

## 2020-07-10 ENCOUNTER — Ambulatory Visit: Payer: Self-pay | Admitting: Nurse Practitioner

## 2020-08-15 ENCOUNTER — Telehealth: Payer: Self-pay

## 2020-08-15 NOTE — Telephone Encounter (Signed)
LMOM for OV on 9/9 

## 2020-08-17 ENCOUNTER — Other Ambulatory Visit: Payer: Self-pay

## 2020-08-17 ENCOUNTER — Encounter: Payer: Self-pay | Admitting: Nurse Practitioner

## 2020-08-17 ENCOUNTER — Ambulatory Visit: Payer: Self-pay | Admitting: Nurse Practitioner

## 2020-08-17 VITALS — BP 123/93 | HR 97 | Temp 97.9°F | Resp 16 | Ht 71.0 in | Wt 225.0 lb

## 2020-08-17 DIAGNOSIS — J0110 Acute frontal sinusitis, unspecified: Secondary | ICD-10-CM

## 2020-08-17 DIAGNOSIS — Z23 Encounter for immunization: Secondary | ICD-10-CM

## 2020-08-17 DIAGNOSIS — R000 Tachycardia, unspecified: Secondary | ICD-10-CM

## 2020-08-17 DIAGNOSIS — F5101 Primary insomnia: Secondary | ICD-10-CM

## 2020-08-17 MED ORDER — ZOLPIDEM TARTRATE 10 MG PO TABS: ORAL_TABLET | ORAL | 5 refills | Status: DC

## 2020-08-17 MED ORDER — METOPROLOL TARTRATE 25 MG PO TABS: ORAL_TABLET | ORAL | 3 refills | Status: DC

## 2020-08-17 MED ORDER — AMOXICILLIN-POT CLAVULANATE 875-125 MG PO TABS: 1.0000 | ORAL_TABLET | Freq: Two times a day (BID) | ORAL | 0 refills | Status: DC

## 2020-08-17 NOTE — Progress Notes (Signed)
[facility] [address] [place], [place] [address]  Internal MEDICINE  Office Visit Note  Patient Name: Daniel Jimenez  [id number]  [id number]  Date of Service: 09/03/2020  Chief Complaint  Patient presents with  . Follow-up  . Gastroesophageal Reflux  . Quality Metric Gaps    flu vaccine, tetnaus, Hep C  . Hypertension    The patient is here for routine follow up visit. Today, blood pressure and heart rate are both well managed. He is taking metoprolol 1/2 tablet twice daily. Doing well with current dosing of medication. The patient denies chest pressure, chest pressure, or shortness of breath. He denies headache. He would like to get flu shot while he is here today. He does take ambein at night to help him sleep. He does take this most nights. Has trouble falling asleep. He states that taking ambien at night helps him fall asleep. He is able to stay asleep and wak up in the monrnigns without feeling tired the next day. He would like to have refills for this today.  He does have chronic nasal congestion, post nasal drip, sinus headache, and cough. He also has environmental allergies which are particularly bad during this time of year.       Current Medication: Outpatient Encounter Medications as of 08/17/2020  Medication Sig  . hydrOXYzine (ATARAX/VISTARIL) 25 MG tablet Take 1 tablet (25 mg total) by mouth 3 (three) times daily as needed.  . metoprolol tartrate (LOPRESSOR) 25 MG tablet Take 1/2 tablet po BID  . omeprazole (PRILOSEC) 40 MG capsule Take 40 mg by mouth daily.  . sildenafil (VIAGRA) 50 MG tablet Take 1 to 2 tablets po QD prn  . zolpidem (AMBIEN) 10 MG tablet Take 1 tab po at bedtime as needed for sleep  . [DISCONTINUED] metoprolol tartrate (LOPRESSOR) 25 MG tablet Take 1/2 tablet po BID  . [DISCONTINUED] zolpidem (AMBIEN) 10 MG tablet Take 1 tab po at bedtime as needed for sleep  . amoxicillin-clavulanate (AUGMENTIN) 875-125 MG tablet Take 1 tablet by  mouth 2 (two) times daily.   No facility-administered encounter medications on file as of 08/17/2020.    Surgical History: Past Surgical History:  Procedure Laterality Date  . ELBOW SURGERY Right 2009    Medical History: Past Medical History:  Diagnosis Date  . Acid reflux   . Hypertension   . Kidney stone     Family History: Family History  Problem Relation Age of Onset  . Diabetes Father     Social History   Socioeconomic History  . Marital status: Married    Spouse name: Not on file  . Number of children: Not on file  . Years of education: Not on file  . Highest education level: Not on file  Occupational History  . Not on file  Tobacco Use  . Smoking status: Former Smoker    Packs/day: 1.00    Types: Cigarettes  . Smokeless tobacco: Never Used  Vaping Use  . Vaping Use: Never used  Substance and Sexual Activity  . Alcohol use: Yes    Comment: occasionally  . Drug use: No  . Sexual activity: Not on file  Other Topics Concern  . Not on file  Social History Narrative  . Not on file   Social Determinants of Health   Financial Resource Strain:   . Difficulty of Paying Living Expenses: Not on file  Food Insecurity:   . Worried About Programme researcher, broadcasting/film/video in the Last Year: Not  on file  . Ran Out of Food in the Last Year: Not on file  Transportation Needs:   . Lack of Transportation (Medical): Not on file  . Lack of Transportation (Non-Medical): Not on file  Physical Activity:   . Days of Exercise per Week: Not on file  . Minutes of Exercise per Session: Not on file  Stress:   . Feeling of Stress : Not on file  Social Connections:   . Frequency of Communication with Friends and Family: Not on file  . Frequency of Social Gatherings with Friends and Family: Not on file  . Attends Religious Services: Not on file  . Active Member of Clubs or Organizations: Not on file  . Attends Banker Meetings: Not on file  . Marital Status: Not on file   Intimate Partner Violence:   . Fear of Current or Ex-Partner: Not on file  . Emotionally Abused: Not on file  . Physically Abused: Not on file  . Sexually Abused: Not on file      Review of Systems  Constitutional: Negative for chills, fatigue and unexpected weight change.  HENT: Positive for congestion, postnasal drip, rhinorrhea, sinus pressure and sinus pain. Negative for sneezing and sore throat.   Respiratory: Negative for cough, chest tightness, shortness of breath and wheezing.   Cardiovascular: Negative for chest pain and palpitations.       Well managed blood pressure and heart rate today.  Gastrointestinal: Negative for abdominal pain, constipation, diarrhea, nausea and vomiting.  Endocrine: Negative for cold intolerance, heat intolerance, polydipsia and polyuria.  Musculoskeletal: Positive for arthralgias, back pain and myalgias. Negative for joint swelling and neck pain.  Skin: Negative for rash.  Allergic/Immunologic: Positive for environmental allergies.  Neurological: Negative for dizziness, tremors, numbness and headaches.  Hematological: Negative for adenopathy. Does not bruise/bleed easily.  Psychiatric/Behavioral: Positive for sleep disturbance. Negative for behavioral problems (Depression) and suicidal ideas. The patient is not nervous/anxious.     Today's Vitals   08/17/20 1548  BP: (!) 123/93  Pulse: 97  Resp: 16  Temp: 97.9 F (36.6 C)  SpO2: 98%  Weight: 225 lb (102.1 kg)  Height: 5\' 11"  (1.803 m)   Body mass index is 31.38 kg/m.  Physical Exam Vitals and nursing note reviewed.  Constitutional:      General: He is not in acute distress.    Appearance: Normal appearance. He is well-developed. He is not diaphoretic.  HENT:     Head: Normocephalic and atraumatic.     Right Ear: Tympanic membrane is bulging.     Left Ear: Tympanic membrane is bulging.     Nose: Congestion present.     Right Turbinates: Swollen.     Left Turbinates: Swollen.      Right Sinus: Frontal sinus tenderness present.     Left Sinus: Frontal sinus tenderness present.     Mouth/Throat:     Pharynx: Posterior oropharyngeal erythema present. No oropharyngeal exudate.     Comments: Chronic post nasal drip present. Eyes:     Pupils: Pupils are equal, round, and reactive to light.  Neck:     Thyroid: No thyromegaly.     Vascular: No JVD.     Trachea: No tracheal deviation.  Cardiovascular:     Rate and Rhythm: Normal rate and regular rhythm.     Heart sounds: Normal heart sounds. No murmur heard.  No friction rub. No gallop.   Pulmonary:     Effort: Pulmonary effort is normal. No  respiratory distress.     Breath sounds: Normal breath sounds. No wheezing or rales.  Chest:     Chest wall: No tenderness.  Abdominal:     Palpations: Abdomen is soft.     Tenderness: There is no abdominal tenderness. There is no guarding.  Musculoskeletal:        General: Normal range of motion.     Cervical back: Normal range of motion and neck supple.     Comments: Patient has generalized joint pain, most severe in elbows, shoulders, and knees. Movement and exertion make this worse.   Lymphadenopathy:     Cervical: No cervical adenopathy.  Skin:    General: Skin is warm and dry.  Neurological:     Mental Status: He is alert and oriented to person, place, and time.     Cranial Nerves: No cranial nerve deficit.  Psychiatric:        Mood and Affect: Mood normal.        Behavior: Behavior normal.        Thought Content: Thought content normal.        Judgment: Judgment normal.    Assessment/Plan: 1. Tachycardia Stable. Continue to take metoprolol as prescribed. Refills provided today.  - metoprolol tartrate (LOPRESSOR) 25 MG tablet; Take 1/2 tablet po BID  Dispense: 30 tablet; Refill: 3  2. Acute non-recurrent frontal sinusitis Start augmentin 875mg  twice daily for 10 days. Take OTC medication as needed and as indicated for acute symptoms.  - amoxicillin-clavulanate  (AUGMENTIN) 875-125 MG tablet; Take 1 tablet by mouth 2 (two) times daily.  Dispense: 20 tablet; Refill: 0  3. Primary insomnia May take ambien 10mg  at bedtime as needed for insomnia. New prescription sent to his pharmacy today.  - zolpidem (AMBIEN) 10 MG tablet; Take 1 tab po at bedtime as needed for sleep  Dispense: 30 tablet; Refill: 5  4. Needs flu shot Flu vaccine administered in the office today.  - Flu Vaccine MDCK QUAD PF  General Counseling: Vitor verbalizes understanding of the findings of todays visit and agrees with plan of treatment. I have discussed any further diagnostic evaluation that may be needed or ordered today. We also reviewed his medications today. he has been encouraged to call the office with any questions or concerns that should arise related to todays visit.  This patient was seen by FNP Collaboration with Dr Casimiro Needle as a part of collaborative care agreement  Orders Placed This Encounter  Procedures  . Flu Vaccine MDCK QUAD PF    Meds ordered this encounter  Medications  . zolpidem (AMBIEN) 10 MG tablet    Sig: Take 1 tab po at bedtime as needed for sleep    Dispense:  30 tablet    Refill:  5    Order Specific Question:   Supervising Provider    Answer:   Vincent Gros [[id number]]  . metoprolol tartrate (LOPRESSOR) 25 MG tablet    Sig: Take 1/2 tablet po BID    Dispense:  30 tablet    Refill:  3    Order Specific Question:   Supervising Provider    Answer:   Lyndon Code [[id number]]  . amoxicillin-clavulanate (AUGMENTIN) 875-125 MG tablet    Sig: Take 1 tablet by mouth 2 (two) times daily.    Dispense:  20 tablet    Refill:  0    Order Specific Question:   Supervising Provider    Answer:   Lyndon Code [  [id number]]    Total time spent: 30 Minutes   Time spent includes review of chart, medications, test results, and follow up plan with the patient.      Dr Lyndon CodeFozia M Khan Internal medicine

## 2020-09-03 DIAGNOSIS — J0110 Acute frontal sinusitis, unspecified: Secondary | ICD-10-CM | POA: Insufficient documentation

## 2020-09-03 DIAGNOSIS — Z23 Encounter for immunization: Secondary | ICD-10-CM | POA: Insufficient documentation

## 2021-02-12 ENCOUNTER — Encounter: Payer: Self-pay | Admitting: Physician Assistant

## 2021-02-14 ENCOUNTER — Other Ambulatory Visit: Payer: Self-pay

## 2021-02-14 ENCOUNTER — Encounter: Payer: Self-pay | Admitting: Nurse Practitioner

## 2021-02-14 ENCOUNTER — Ambulatory Visit (INDEPENDENT_AMBULATORY_CARE_PROVIDER_SITE_OTHER): Payer: Self-pay | Admitting: Nurse Practitioner

## 2021-02-14 VITALS — BP 127/89 | HR 94 | Temp 98.5°F | Ht 69.29 in | Wt 220.0 lb

## 2021-02-14 DIAGNOSIS — R000 Tachycardia, unspecified: Secondary | ICD-10-CM

## 2021-02-14 DIAGNOSIS — F5101 Primary insomnia: Secondary | ICD-10-CM

## 2021-02-14 DIAGNOSIS — L209 Atopic dermatitis, unspecified: Secondary | ICD-10-CM | POA: Insufficient documentation

## 2021-02-14 DIAGNOSIS — M2550 Pain in unspecified joint: Secondary | ICD-10-CM

## 2021-02-14 DIAGNOSIS — Z7689 Persons encountering health services in other specified circumstances: Secondary | ICD-10-CM | POA: Insufficient documentation

## 2021-02-14 MED ORDER — TRIAMCINOLONE ACETONIDE 0.025 % EX CREA: 1.0000 "application " | TOPICAL_CREAM | Freq: Two times a day (BID) | CUTANEOUS | 2 refills | Status: DC

## 2021-02-14 MED ORDER — CYCLOBENZAPRINE HCL 10 MG PO TABS: ORAL_TABLET | ORAL | 2 refills | Status: DC

## 2021-02-14 MED ORDER — ZOLPIDEM TARTRATE 10 MG PO TABS: ORAL_TABLET | ORAL | 5 refills | Status: DC

## 2021-02-14 MED ORDER — METOPROLOL TARTRATE 25 MG PO TABS: ORAL_TABLET | ORAL | 3 refills | Status: DC

## 2021-02-14 MED ORDER — PREDNISONE 10 MG (48) PO TBPK: ORAL_TABLET | ORAL | 0 refills | Status: DC

## 2021-02-14 NOTE — Progress Notes (Signed)
New Patient Office Visit  Subjective:  Patient ID: Daniel Jimenez, male    DOB: November 11, 1976  Age: 45 y.o. MRN: [id number]  CC:  Chief Complaint  Patient presents with  . New Patient (Initial Visit)    HPI Daniel Jimenez presents to establish new primary care provider. He has complaint of generalized joint and muscle pain. Feels like muscles are completely knotted up. They feel very tight. This has been ongoing for several years. He has seen multiple orthopedic providers and rheumatology as well. It is generally agreed upon that patient has some sort of auto-immune inflammatory condition, however, no specific diagnosis has ever been made. He states that now that he does not have insurance, it is much more difficult for him to see specialty providers. 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 He will take anti-inflammatory medications when needed. These help some, but for only short periods of time. When pain is no longer bearable, he will take short burst of prednisone. This medication helps more than anything. Though prescribed as a 12 day taper, he will generally take for 3 or 4 days, then stop.   He will get approximately 2 to 3 weeks improvement in joint pain after this. He also sees a massage therapist. Tries to have deep tissue massage every one to two weeks. With this, he notices immediate improvement, but lasts for only a week or so. Can get expensive to see massage therapist more often than that.  The patient has a round, raised, rough lesion on right mid forearm. It is white, hard and itchy. There is area of redness surrounding the central lesion. Patient states that he often picks at the lesion and this seems to make it much bigger in size. He has several areas of red, itchy patches. When inflamed, these areas get very itchy and flaky. There are a few areas on both of his forearms, his back, his neck, and his  face. Unclear to patient what causes these areas to get inflamed.  The patient does take ambien as needed to help with insomnia. States that he it is difficult for him to shut off his mind and get to sleep. Sometimes he only needs to take 1/2 tablet and other times he needs to take the whole tablet. He does need to get a refill for this today.  Has history of rapid heart rate. Does take metoprolol 25mg , 1/2 tablet twice daily. States that he has been very good without forgetting it. Denies episodes of chest pain, chest pressure, palpitations, or shortness of breath. Denies headaches since starting on metoprolol.  Past Medical History:  Diagnosis Date  . Acid reflux   . Hypertension   . Kidney stone     Past Surgical History:  Procedure Laterality Date  . ELBOW SURGERY Right 2009    Family History  Problem Relation Age of Onset  . Diabetes Father     Social History   Socioeconomic History  . Marital status: Married    Spouse name: Not on file  . Number of children: Not on file  . Years of education: Not on file  . Highest education level: Not on file  Occupational History  . Not on file  Tobacco Use  . Smoking status: Former Smoker    Packs/day: 1.00    Types: Cigarettes  . Smokeless tobacco: Never Used  Vaping Use  . Vaping Use: Never used  Substance and Sexual Activity  . Alcohol use: Yes    Comment: occasionally  . Drug use: No  . Sexual activity: Not on file  Other Topics Concern  . Not on file  Social History Narrative  . Not on file   Social Determinants of Health   Financial Resource Strain: Not on file  Food Insecurity: Not on file  Transportation Needs: Not on file  Physical Activity: Not on file  Stress: Not on file  Social Connections: Not on file  Intimate Partner Violence: Not on file    ROS Review of Systems  Constitutional: Negative for activity change, chills and fever.  HENT: Negative for congestion and sinus pain.   Respiratory: Negative  for cough and wheezing.   Cardiovascular: Negative for chest pain and palpitations.       History of elevated heart rate.   Gastrointestinal: Negative for constipation.       Well managed GERD.   Musculoskeletal: Positive for arthralgias, back pain and myalgias.  Skin: Negative for rash.       Lesion on right inner forearm which is white and he feels like it is getting larger over time.  Few areas of red and inflamed rash on the arms, back, neck, and face.   Allergic/Immunologic: Positive for environmental allergies.  Neurological: Negative for dizziness, weakness and headaches.  Psychiatric/Behavioral: Positive for sleep disturbance. The patient is not nervous/anxious.   All other systems reviewed and are negative.   Objective:   Today's Vitals   02/14/21 1405 02/14/21 1451  BP: 127/89   Pulse: (!) 49 94  Temp: 98.5 F (36.9 C)   SpO2: 96%   Weight: 220 lb (99.8 kg)   Height: 5' 9.29" (1.76 m)    Body mass index is 32.22 kg/m. Physical Exam Vitals and nursing note reviewed.  Constitutional:      Appearance: Normal appearance. He is well-developed and well-nourished.  HENT:     Head: Normocephalic.     Nose: Nose normal.  Eyes:     Pupils: Pupils are equal, round, and reactive to light.  Cardiovascular:     Rate and Rhythm: Normal rate and regular rhythm.     Pulses: Normal pulses.     Heart sounds: Normal heart sounds.  Pulmonary:     Effort: Pulmonary effort is normal.     Breath sounds: Normal breath sounds.  Abdominal:     Palpations: Abdomen is soft.  Musculoskeletal:        General: Normal range of motion.     Cervical back: Normal range of motion and neck supple.     Comments: Generalized joint pain without point tenderness present. No bony deformities or abnormalities noted at this time.   Skin:    General: Skin is warm and dry.     Capillary Refill: Capillary refill takes less than 2 seconds.     Findings: Lesion present.          Comments: Several  patchy, red areas on both forearms, neck, and face noted. Inflamed appearing. Intact skin.   Neurological:     General: No focal deficit present.     Mental Status: He is alert and oriented to person, place, and time.  Psychiatric:        Mood and Affect: Mood and affect and mood normal.        Behavior: Behavior normal.        Thought Content: Thought content normal.        Judgment:  Judgment normal.     Assessment & Plan:  1. Encounter to establish care Appointment today to establish new primary care provider.   2. Tachycardia Stable. Continue to take 12.5mg  metoprolol twice daily. Continue to monitor closely.  - metoprolol tartrate (LOPRESSOR) 25 MG tablet; Take 1/2 tablet po BID  Dispense: 30 tablet; Refill: 3  3. Generalized joint pain Trial of cyclobenzaprine 10mg  tablets. May take 1/2 to 1 tablet at night as needed for joint and muscle pain. Continue to use NSAIDs as needed and as indicated. Continue with massage therapy as scheduled and as needed. Will add prednisone taper to take as dicussed for severe joint pain/inflammation.  - cyclobenzaprine (FLEXERIL) 10 MG tablet; Take 1/2 to 1 tablet po QPM prn muscle pain  Dispense: 30 tablet; Refill: 2 - predniSONE (STERAPRED UNI-PAK 48 TAB) 10 MG (48) TBPK tablet; 12 day taper - take by mouth as directed for 12 days  Dispense: 48 tablet; Refill: 0  4. Primary insomnia May continue to take ambien 5 to 10 mg at bedtime as needed for insomnia. Advised him to hold this if taking cyclobenzaprine. Combination may cause increased sedation and even dizziness. He voiced understanding and agreement with the instructions.  - zolpidem (AMBIEN) 10 MG tablet; Take 1 tab po at bedtime as needed for sleep  Dispense: 30 tablet; Refill: 5  5. Atopic dermatitis, unspecified type Add triamcinolone cream - apply to effected areas up to twice daily as needed. Consider referral to dermatology as needed for single lesion on the right forearm, if gets larger  or changes in shape or contour.  - triamcinolone (KENALOG) 0.025 % cream; Apply 1 application topically 2 (two) times daily.  Dispense: 80 g; Refill: 2   Problem List Items Addressed This Visit      Nervous and Auditory   Intervertebral disc disorder with radiculopathy of lumbar region   Relevant Medications   cyclobenzaprine (FLEXERIL) 10 MG tablet   zolpidem (AMBIEN) 10 MG tablet   predniSONE (STERAPRED UNI-PAK 48 TAB) 10 MG (48) TBPK tablet     Musculoskeletal and Integument   Atopic dermatitis   Relevant Medications   triamcinolone (KENALOG) 0.025 % cream     Other   Tachycardia   Relevant Medications   metoprolol tartrate (LOPRESSOR) 25 MG tablet   Primary insomnia   Relevant Medications   zolpidem (AMBIEN) 10 MG tablet   Encounter to establish care - Primary      Outpatient Encounter Medications as of 02/14/2021  Medication Sig  . cyclobenzaprine (FLEXERIL) 10 MG tablet Take 1/2 to 1 tablet po QPM prn muscle pain  . omeprazole (PRILOSEC) 40 MG capsule Take 40 mg by mouth daily.  . predniSONE (STERAPRED UNI-PAK 48 TAB) 10 MG (48) TBPK tablet 12 day taper - take by mouth as directed for 12 days  . triamcinolone (KENALOG) 0.025 % cream Apply 1 application topically 2 (two) times daily.  . [DISCONTINUED] metoprolol tartrate (LOPRESSOR) 25 MG tablet Take 1/2 tablet po BID  . [DISCONTINUED] zolpidem (AMBIEN) 10 MG tablet Take 1 tab po at bedtime as needed for sleep  . metoprolol tartrate (LOPRESSOR) 25 MG tablet Take 1/2 tablet po BID  . sildenafil (VIAGRA) 50 MG tablet Take 1 to 2 tablets po QD prn  . zolpidem (AMBIEN) 10 MG tablet Take 1 tab po at bedtime as needed for sleep  . [DISCONTINUED] amoxicillin-clavulanate (AUGMENTIN) 875-125 MG tablet Take 1 tablet by mouth 2 (two) times daily.  . [DISCONTINUED] hydrOXYzine (ATARAX/VISTARIL) 25 MG  tablet Take 1 tablet (25 mg total) by mouth 3 (three) times daily as needed.   No facility-administered encounter medications on file  as of 02/14/2021.   Time spent with the patient was approximately 30 minutes. This time included reviewing progress notes, labs, imaging studies, and discussing plan for follow up.    Follow-up: Return in about 6 months (around 08/17/2021) for PHYSICAL.   Carlean Jews, NP

## 2021-02-14 NOTE — Patient Instructions (Signed)
Osteoarthritis  Osteoarthritis is a type of arthritis. It refers to joint pain or joint disease. Osteoarthritis affects tissue that covers the ends of bones in joints (cartilage). Cartilage acts as a cushion between the bones and helps them move smoothly. Osteoarthritis occurs when cartilage in the joints gets worn down. Osteoarthritis is sometimes called "wear and tear" arthritis. Osteoarthritis is the most common form of arthritis. It often occurs in older people. It is a condition that gets worse over time. The joints most often affected by this condition are in the fingers, toes, hips, knees, and spine, including the neck and lower back. What are the causes? This condition is caused by the wearing down of cartilage that covers the ends of bones. What increases the risk? The following factors may make you more likely to develop this condition:  Being age 50 or older.  Obesity.  Overuse of joints.  Past injury of a joint.  Past surgery on a joint.  Family history of osteoarthritis. What are the signs or symptoms? The main symptoms of this condition are pain, swelling, and stiffness in the joint. Other symptoms may include:  An enlarged joint.  More pain and further damage caused by small pieces of bone or cartilage that break off and float inside of the joint.  Small deposits of bone (osteophytes) that grow on the edges of the joint.  A grating or scraping feeling inside the joint when you move it.  Popping or creaking sounds when you move.  Difficulty walking or exercising.  An inability to grip items, twist your hand(s), or control the movements of your hands and fingers. How is this diagnosed? This condition may be diagnosed based on:  Your medical history.  A physical exam.  Your symptoms.  X-rays of the affected joint(s).  Blood tests to rule out other types of arthritis. How is this treated? There is no cure for this condition, but treatment can help control  pain and improve joint function. Treatment may include a combination of therapies, such as:  Pain relief techniques, such as: ? Applying heat and cold to the joint. ? Massage. ? A form of talk therapy called cognitive behavioral therapy (CBT). This therapy helps you set goals and follow up on the changes that you make.  Medicines for pain and inflammation. The medicines can be taken by mouth or applied to the skin. They include: ? NSAIDs, such as ibuprofen. ? Prescription medicines. ? Strong anti-inflammatory medicines (corticosteroids). ? Certain nutritional supplements.  A prescribed exercise program. You may work with a physical therapist.  Assistive devices, such as a brace, wrap, splint, specialized glove, or cane.  A weight control plan.  Surgery, such as: ? An osteotomy. This is done to reposition the bones and relieve pain or to remove loose pieces of bone and cartilage. ? Joint replacement surgery. You may need this surgery if you have advanced osteoarthritis. Follow these instructions at home: Activity  Rest your affected joints as told by your health care provider.  Exercise as told by your health care provider. He or she may recommend specific types of exercise, such as: ? Strengthening exercises. These are done to strengthen the muscles that support joints affected by arthritis. ? Aerobic activities. These are exercises, such as brisk walking or water aerobics, that increase your heart rate. ? Range-of-motion activities. These help your joints move more easily. ? Balance and agility exercises. Managing pain, stiffness, and swelling  If directed, apply heat to the affected area as often   as told by your health care provider. Use the heat source that your health care provider recommends, such as a moist heat pack or a heating pad. ? If you have a removable assistive device, remove it as told by your health care provider. ? Place a towel between your skin and the heat  source. If your health care provider tells you to keep the assistive device on while you apply heat, place a towel between the assistive device and the heat source. ? Leave the heat on for 20-30 minutes. ? Remove the heat if your skin turns bright red. This is especially important if you are unable to feel pain, heat, or cold. You may have a greater risk of getting burned.  If directed, put ice on the affected area. To do this: ? If you have a removable assistive device, remove it as told by your health care provider. ? Put ice in a plastic bag. ? Place a towel between your skin and the bag. If your health care provider tells you to keep the assistive device on during icing, place a towel between the assistive device and the bag. ? Leave the ice on for 20 minutes, 2-3 times a day. ? Move your fingers or toes often to reduce stiffness and swelling. ? Raise (elevate) the injured area above the level of your heart while you are sitting or lying down.      General instructions  Take over-the-counter and prescription medicines only as told by your health care provider.  Maintain a healthy weight. Follow instructions from your health care provider for weight control.  Do not use any products that contain nicotine or tobacco, such as cigarettes, e-cigarettes, and chewing tobacco. If you need help quitting, ask your health care provider.  Use assistive devices as told by your health care provider.  Keep all follow-up visits as told by your health care provider. This is important. Where to find more information  National Institute of Arthritis and Musculoskeletal and Skin Diseases: www.niams.nih.gov  National Institute on Aging: www.nia.nih.gov  American College of Rheumatology: www.rheumatology.org Contact a health care provider if:  You have redness, swelling, or a feeling of warmth in a joint that gets worse.  You have a fever along with joint or muscle aches.  You develop a  rash.  You have trouble doing your normal activities. Get help right away if:  You have pain that gets worse and is not relieved by pain medicine. Summary  Osteoarthritis is a type of arthritis that affects tissue covering the ends of bones in joints (cartilage).  This condition is caused by the wearing down of cartilage that covers the ends of bones.  The main symptom of this condition is pain, swelling, and stiffness in the joint.  There is no cure for this condition, but treatment can help control pain and improve joint function. This information is not intended to replace advice given to you by your health care provider. Make sure you discuss any questions you have with your health care provider. Document Revised: 11/22/2019 Document Reviewed: 11/22/2019 [employer] Patient Education  2021 [employer]  

## 2021-03-07 ENCOUNTER — Encounter: Payer: Self-pay | Admitting: Nurse Practitioner

## 2021-03-08 ENCOUNTER — Encounter: Payer: Self-pay | Admitting: Nurse Practitioner

## 2021-08-20 ENCOUNTER — Encounter: Payer: Self-pay | Admitting: Nurse Practitioner

## 2021-09-05 ENCOUNTER — Other Ambulatory Visit: Payer: Self-pay

## 2021-09-05 ENCOUNTER — Encounter: Payer: Self-pay | Admitting: Nurse Practitioner

## 2021-09-05 ENCOUNTER — Ambulatory Visit (INDEPENDENT_AMBULATORY_CARE_PROVIDER_SITE_OTHER): Payer: Self-pay | Admitting: Nurse Practitioner

## 2021-09-05 VITALS — BP 129/87 | HR 99 | Temp 98.0°F | Ht 69.0 in | Wt 217.1 lb

## 2021-09-05 DIAGNOSIS — R000 Tachycardia, unspecified: Secondary | ICD-10-CM

## 2021-09-05 DIAGNOSIS — M2550 Pain in unspecified joint: Secondary | ICD-10-CM

## 2021-09-05 DIAGNOSIS — Z23 Encounter for immunization: Secondary | ICD-10-CM

## 2021-09-05 DIAGNOSIS — Z0001 Encounter for general adult medical examination with abnormal findings: Secondary | ICD-10-CM

## 2021-09-05 DIAGNOSIS — F5101 Primary insomnia: Secondary | ICD-10-CM

## 2021-09-05 MED ORDER — ZOLPIDEM TARTRATE 10 MG PO TABS: ORAL_TABLET | ORAL | 5 refills | Status: DC

## 2021-09-05 MED ORDER — METOPROLOL TARTRATE 25 MG PO TABS: ORAL_TABLET | ORAL | 5 refills | Status: DC

## 2021-09-05 MED ORDER — CYCLOBENZAPRINE HCL 10 MG PO TABS: ORAL_TABLET | ORAL | 5 refills | Status: DC

## 2021-09-05 NOTE — Progress Notes (Signed)
Established Patient Office Visit  Subjective:  Patient ID: Daniel Jimenez, male    DOB: [birth year]  Age: 45 y.o. MRN: [id number]  CC: No chief complaint on file.   HPI Daniel Jimenez presents for routine wellness visit. Working long hours. No new problems or concerns.  Does have difficulty sleeping. Takes [place] on as needed. Prescription has expired with two refills remaining. He does take muscle relaxer to help with tight and sore muscles and tendons. He states that this has really helped him. Takes 1/2 tablet most days. Rests better. Does better the next day. Has been able to cut back on use of omeprazole. Blood pressure is doing well. Pulse is mildly elevated. Denies chest pain, palpitations, or chest pressure.  Did have routine, blood work done 02/2020. Mildly elevated LDL and triglycerides. Otherwise, labs were good.  We will recheck routine, fasting labs and March 2023.  Lipid Panel     Component Value Date/Time   CHOL 177 02/29/2020 1510   TRIG 198 (H) 02/29/2020 1510   HDL 37 (L) 02/29/2020 1510   LDLCALC 106 (H) 02/29/2020 1510   LABVLDL 34 02/29/2020 1510    Patient has not new concerns or complaints. denies chest pain, chest pressure, or shortness of breath. He denies headaches or visual disturbances. He denies abdominal pain, nausea, vomiting, or changes in bowel or bladder  habits.    Past Medical History:  Diagnosis Date   Acid reflux    Hypertension    Kidney stone     Past Surgical History:  Procedure Laterality Date   ELBOW SURGERY Right 2009    Family History  Problem Relation Age of Onset   Diabetes Father     Social History   Socioeconomic History   Marital status: Married    Spouse name: Not on file   Number of children: Not on file   Years of education: Not on file   Highest education level: Not on file  Occupational History   Not on file  Tobacco Use   Smoking status: Former    Packs/day: 1.00    Types: Cigarettes   Smokeless  tobacco: Never  Vaping Use   Vaping Use: Never used  Substance and Sexual Activity   Alcohol use: Yes    Comment: occasionally   Drug use: No   Sexual activity: Not on file  Other Topics Concern   Not on file  Social History Narrative   Not on file   Social Determinants of Health   Financial Resource Strain: Not on file  Food Insecurity: Not on file  Transportation Needs: Not on file  Physical Activity: Not on file  Stress: Not on file  Social Connections: Not on file  Intimate Partner Violence: Not on file    Outpatient Medications Prior to Visit  Medication Sig Dispense Refill   omeprazole (PRILOSEC) 40 MG capsule Take 40 mg by mouth daily.     predniSONE (STERAPRED UNI-PAK 48 TAB) 10 MG (48) TBPK tablet 12 day taper - take by mouth as directed for 12 days 48 tablet 0   triamcinolone (KENALOG) 0.025 % cream Apply 1 application topically 2 (two) times daily. 80 g 2   cyclobenzaprine (FLEXERIL) 10 MG tablet Take 1/2 to 1 tablet po QPM prn muscle pain 30 tablet 2   metoprolol tartrate (LOPRESSOR) 25 MG tablet Take 1/2 tablet po BID 30 tablet 3   zolpidem (AMBIEN) 10 MG tablet Take 1 tab po at bedtime as needed for sleep  30 tablet 5   No facility-administered medications prior to visit.    No Known Allergies  ROS Review of Systems  Constitutional:  Negative for activity change, chills, fatigue and fever.  HENT:  Negative for congestion, postnasal drip, rhinorrhea, sinus pressure, sinus pain, sneezing and sore throat.   Eyes: Negative.   Respiratory:  Negative for cough, shortness of breath and wheezing.   Cardiovascular:  Negative for chest pain and palpitations.  Gastrointestinal:  Negative for constipation, diarrhea, nausea and vomiting.  Endocrine: Negative for cold intolerance, heat intolerance, polydipsia and polyuria.  Genitourinary:  Negative for dysuria, frequency and urgency.  Musculoskeletal:  Positive for arthralgias, back pain and myalgias.  Skin:  Negative  for rash.  Allergic/Immunologic: Negative for environmental allergies.  Neurological:  Negative for dizziness, weakness and headaches.  Psychiatric/Behavioral:  Positive for sleep disturbance. The patient is not nervous/anxious.      Objective:    Physical Exam Vitals and nursing note reviewed.  Constitutional:      Appearance: Normal appearance. He is well-developed.  HENT:     Head: Normocephalic and atraumatic.     Right Ear: Tympanic membrane, ear canal and external ear normal.     Left Ear: Tympanic membrane, ear canal and external ear normal.     Nose: Nose normal.     Mouth/Throat:     Mouth: Mucous membranes are moist.     Pharynx: Oropharynx is clear.  Eyes:     Extraocular Movements: Extraocular movements intact.     Conjunctiva/sclera: Conjunctivae normal.     Pupils: Pupils are equal, round, and reactive to light.  Neck:     Vascular: No carotid bruit.  Cardiovascular:     Rate and Rhythm: Normal rate and regular rhythm.     Pulses: Normal pulses.     Heart sounds: Normal heart sounds.  Pulmonary:     Effort: Pulmonary effort is normal.     Breath sounds: Normal breath sounds.  Abdominal:     General: Bowel sounds are normal.     Palpations: Abdomen is soft.     Tenderness: There is no abdominal tenderness.  Musculoskeletal:        General: Normal range of motion.     Cervical back: Normal range of motion and neck supple.  Lymphadenopathy:     Cervical: No cervical adenopathy.  Skin:    General: Skin is warm and dry.     Capillary Refill: Capillary refill takes less than 2 seconds.  Neurological:     General: No focal deficit present.     Mental Status: He is alert and oriented to person, place, and time.  Psychiatric:        Mood and Affect: Mood normal.        Behavior: Behavior normal.        Thought Content: Thought content normal.        Judgment: Judgment normal.    Today's Vitals   09/05/21 1441  BP: 129/87  Pulse: 99  Temp: 98 F (36.7  C)  SpO2: 99%  Weight: 217 lb 1.6 oz (98.5 kg)  Height: 5\' 9"  (1.753 m)   Body mass index is 32.06 kg/m.   Wt Readings from Last 3 Encounters:  09/05/21 217 lb 1.6 oz (98.5 kg)  02/14/21 220 lb (99.8 kg)  08/17/20 225 lb (102.1 kg)     Health Maintenance Due  Topic Date Due   COVID-19 Vaccine (1) Never done   HIV Screening  Never done  TETANUS/TDAP  Never done    There are no preventive care reminders to display for this patient.  Lab Results  Component Value Date   TSH 2.350 02/29/2020   Lab Results  Component Value Date   WBC 10.4 02/29/2020   HGB 15.5 02/29/2020   HCT 45.7 02/29/2020   MCV 89 02/29/2020   PLT 272 02/29/2020   Lab Results  Component Value Date   NA 141 02/29/2020   K 4.2 02/29/2020   CO2 27 02/29/2020   GLUCOSE 83 02/29/2020   BUN 10 02/29/2020   CREATININE 0.86 02/29/2020   BILITOT 0.3 05/19/2014   ALKPHOS 78 05/19/2014   AST 20 05/19/2014   ALT 25 05/19/2014   PROT 7.1 05/19/2014   ALBUMIN 4.1 05/19/2014   CALCIUM 9.8 02/29/2020   ANIONGAP 7 05/01/2016   Lab Results  Component Value Date   CHOL 177 02/29/2020   Lab Results  Component Value Date   HDL 37 (L) 02/29/2020   Lab Results  Component Value Date   LDLCALC 106 (H) 02/29/2020   Lab Results  Component Value Date   TRIG 198 (H) 02/29/2020   No results found for: [facility] Lab Results  Component Value Date   HGBA1C 5.5 02/29/2020      Assessment & Plan:  1. Encounter for general adult medical examination with abnormal findings Annual health maintenance exam today.  2. Tachycardia Stable.  Continue Toprol 12.5 mg tablets twice daily.  New prescription sent to his pharmacy today. - metoprolol tartrate (LOPRESSOR) 25 MG tablet; Take 1/2 tablet po BID  Dispense: 30 tablet; Refill: 5  3. Generalized joint pain Joint pain improved with addition of muscle relaxer.  May continue to take Flexeril 5 to 10 mg at bedtime as needed.  New prescription sent to his pharmacy  today. - cyclobenzaprine (FLEXERIL) 10 MG tablet; Take 1/2 to 1 tablet po QPM prn muscle pain  Dispense: 30 tablet; Refill: 5  4. Primary insomnia Patient may continue to take Ambien 10 mg at bedtime as needed for insomnia.  New prescription sent to his pharmacy today. - zolpidem (AMBIEN) 10 MG tablet; Take 1 tab po at bedtime as needed for sleep  Dispense: 30 tablet; Refill: 5  5. Need for influenza vaccination Flu vaccine administered during today's visit. - Flu Vaccine QUAD 6+ mos PF IM (Fluarix Quad PF)   Problem List Items Addressed This Visit       Other   Encounter for general adult medical examination with abnormal findings - Primary   Tachycardia   Relevant Medications   metoprolol tartrate (LOPRESSOR) 25 MG tablet   Primary insomnia   Relevant Medications   zolpidem (AMBIEN) 10 MG tablet   Need for influenza vaccination   Relevant Orders   Flu Vaccine QUAD 6+ mos PF IM (Fluarix Quad PF) (Completed)   Generalized joint pain   Relevant Medications   cyclobenzaprine (FLEXERIL) 10 MG tablet    Meds ordered this encounter  Medications   cyclobenzaprine (FLEXERIL) 10 MG tablet    Sig: Take 1/2 to 1 tablet po QPM prn muscle pain    Dispense:  30 tablet    Refill:  5    Order Specific Question:   Supervising Provider    Answer:   Nani Gasser D [[id number]]   metoprolol tartrate (LOPRESSOR) 25 MG tablet    Sig: Take 1/2 tablet po BID    Dispense:  30 tablet    Refill:  5    Order Specific  Question:   Supervising Provider    Answer:   Nani Gasser D [[id number]]   zolpidem (AMBIEN) 10 MG tablet    Sig: Take 1 tab po at bedtime as needed for sleep    Dispense:  30 tablet    Refill:  5    Order Specific Question:   Supervising Provider    Answer:   Nani Gasser D [[id number]]   This note was dictated using Dragon Voice Recognition Software. Rapid proofreading was performed to expedite the delivery of the information. Despite proofreading, phonetic errors will  occur which are common with this voice recognition software. Please take this into consideration. If there are any concerns, please contact our office.    Follow-up: Return in about 6 months (around 03/05/2022) for htn, insomnia.    Carlean Jews, NP

## 2021-09-16 DIAGNOSIS — M2550 Pain in unspecified joint: Secondary | ICD-10-CM | POA: Insufficient documentation

## 2022-03-05 ENCOUNTER — Encounter: Payer: Self-pay | Admitting: Nurse Practitioner

## 2022-03-05 ENCOUNTER — Other Ambulatory Visit: Payer: Self-pay

## 2022-03-05 ENCOUNTER — Ambulatory Visit (INDEPENDENT_AMBULATORY_CARE_PROVIDER_SITE_OTHER): Payer: Self-pay | Admitting: Nurse Practitioner

## 2022-03-05 VITALS — BP 126/85 | HR 110 | Ht 68.9 in | Wt 220.7 lb

## 2022-03-05 DIAGNOSIS — Z6832 Body mass index (BMI) 32.0-32.9, adult: Secondary | ICD-10-CM

## 2022-03-05 DIAGNOSIS — L209 Atopic dermatitis, unspecified: Secondary | ICD-10-CM

## 2022-03-05 DIAGNOSIS — F5101 Primary insomnia: Secondary | ICD-10-CM

## 2022-03-05 DIAGNOSIS — R000 Tachycardia, unspecified: Secondary | ICD-10-CM

## 2022-03-05 DIAGNOSIS — M2550 Pain in unspecified joint: Secondary | ICD-10-CM

## 2022-03-05 MED ORDER — ZOLPIDEM TARTRATE 10 MG PO TABS: ORAL_TABLET | ORAL | 5 refills | Status: DC

## 2022-03-05 NOTE — Progress Notes (Signed)
Established patient visit ? ? ?Patient: Daniel Jimenez   DOB: 12/03/1976   46 y.o. Male  MRN: [id number] ?Visit Date: 03/05/2022 ? ? ?Chief Complaint  ?Patient presents with  ? Follow-up  ? Hypertension  ? ?Subjective  ?  ?HPI  ?The patient is here for follow up  ?-intermittently has rapid heart rate. Toprol 12.5mg  twice daily which generally keeps this well managed.  ?-heart rate is elevated a bit today ?-does have generalized joint pain.  Will take prescribed prednisone for two days and joint pain will go away for several days.  ?-continues to take [place] as needed for sleep.  ? ? ?Medications: ?Outpatient Medications Prior to Visit  ?Medication Sig  ? cyclobenzaprine (FLEXERIL) 10 MG tablet Take 1/2 to 1 tablet po QPM prn muscle pain  ? metoprolol tartrate (LOPRESSOR) 25 MG tablet Take 1/2 tablet po BID  ? omeprazole (PRILOSEC) 40 MG capsule Take 40 mg by mouth daily.  ? predniSONE (STERAPRED UNI-PAK 48 TAB) 10 MG (48) TBPK tablet 12 day taper - take by mouth as directed for 12 days  ? [DISCONTINUED] zolpidem (AMBIEN) 10 MG tablet Take 1 tab po at bedtime as needed for sleep  ? [DISCONTINUED] triamcinolone (KENALOG) 0.025 % cream Apply 1 application topically 2 (two) times daily.  ? ?No facility-administered medications prior to visit.  ? ? ?Review of Systems  ?Constitutional:  Positive for fatigue. Negative for activity change, chills and fever.  ?HENT:  Negative for congestion, postnasal drip, rhinorrhea, sinus pressure, sinus pain, sneezing and sore throat.   ?Eyes: Negative.   ?Respiratory:  Negative for cough, shortness of breath and wheezing.   ?Cardiovascular:  Negative for chest pain and palpitations.  ?     Elevated heart rate today   ?Gastrointestinal:  Negative for constipation, diarrhea, nausea and vomiting.  ?Endocrine: Negative for cold intolerance, heat intolerance, polydipsia and polyuria.  ?Genitourinary:  Negative for dysuria, frequency and urgency.  ?Musculoskeletal:  Positive for arthralgias,  back pain and myalgias.  ?Skin:  Positive for rash.  ?     Outer ankles of both feet. Itchy and scaly. Seems to be spreading up the foot.   ?Allergic/Immunologic: Positive for environmental allergies.  ?Neurological:  Negative for dizziness, weakness and headaches.  ?Psychiatric/Behavioral:  Positive for sleep disturbance. The patient is not nervous/anxious.   ? ? ? Objective  ?  ? ?Today's Vitals  ? 03/05/22 1617 03/05/22 1659  ?BP: 126/85   ?Pulse: (!) 112 (!) 110  ?SpO2: 97%   ?Weight: 220 lb 11.2 oz (100.1 kg)   ?Height: 5' 8.9" (1.75 m)   ? ?Body mass index is 32.69 kg/m?.  ? ?BP Readings from Last 3 Encounters:  ?03/05/22 126/85  ?09/05/21 129/87  ?02/14/21 127/89  ?  ?Wt Readings from Last 3 Encounters:  ?03/05/22 220 lb 11.2 oz (100.1 kg)  ?09/05/21 217 lb 1.6 oz (98.5 kg)  ?02/14/21 220 lb (99.8 kg)  ?  ?Physical Exam ?Vitals and nursing note reviewed.  ?Constitutional:   ?   Appearance: Normal appearance. He is well-developed.  ?HENT:  ?   Head: Normocephalic and atraumatic.  ?   Nose: Congestion present.  ?   Mouth/Throat:  ?   Mouth: Mucous membranes are moist.  ?   Pharynx: Oropharynx is clear.  ?Eyes:  ?   Extraocular Movements: Extraocular movements intact.  ?   Conjunctiva/sclera: Conjunctivae normal.  ?   Pupils: Pupils are equal, round, and reactive to light.  ?Cardiovascular:  ?  Rate and Rhythm: Normal rate and regular rhythm.  ?   Pulses: Normal pulses.  ?   Heart sounds: Normal heart sounds.  ?   Comments: Mild tachycardia noted  ?Pulmonary:  ?   Effort: Pulmonary effort is normal.  ?   Breath sounds: Normal breath sounds.  ?Abdominal:  ?   Palpations: Abdomen is soft.  ?Musculoskeletal:     ?   General: Normal range of motion.  ?   Cervical back: Normal range of motion and neck supple.  ?Lymphadenopathy:  ?   Cervical: No cervical adenopathy.  ?Skin: ?   General: Skin is warm and dry.  ?   Capillary Refill: Capillary refill takes less than 2 seconds.  ?   Findings: Rash present.  ? ?     ?Neurological:  ?   General: No focal deficit present.  ?   Mental Status: He is alert and oriented to person, place, and time.  ?Psychiatric:     ?   Mood and Affect: Mood normal.     ?   Behavior: Behavior normal.     ?   Thought Content: Thought content normal.     ?   Judgment: Judgment normal.  ?  ? ? Assessment & Plan  ?  ?1. Tachycardia ?Mild tachycardia present today.  Heart rate and rhythm is regular.  Encouraged him to take Toprol 12.5 mg twice daily.  Patient consistently taking it once.  Does need second dose.  Encouraged him to monitor his heart rate routinely.  If this does not improve (70 to 100 bpm).,  He should contact the office.  He voiced understanding and agreement. ? ?2. Primary insomnia ?Patient may continue Ambien 10 mg at bedtime as needed for insomnia.  New prescription sent to pharmacy today. ?- zolpidem (AMBIEN) 10 MG tablet; Take 1 tab po at bedtime as needed for sleep  Dispense: 30 tablet; Refill: 5 ? ?3. Body mass index (BMI) of 32.0-32.9 in adult ?Encourage patient to limit calorie intake to 2000 cal/day or less.  He should consume a low cholesterol, low-fat diet.   ? ?4. Generalized joint pain ?Patient may take prednisone as needed and as previously prescribed. ? ?5. Atopic dermatitis, unspecified type ?Prednisone taper as previously prescribed.  Recommend he apply hydrocortisone cream to affected areas as needed and as indicated. ? ? ?Problem List Items Addressed This Visit   ? ?  ? Musculoskeletal and Integument  ? Atopic dermatitis  ?  ? Other  ? Tachycardia - Primary  ? Primary insomnia  ? Relevant Medications  ? zolpidem (AMBIEN) 10 MG tablet  ? Generalized joint pain  ? Body mass index (BMI) of 32.0-32.9 in adult  ?  ? ?Return in about 6 months (around 09/05/2022) for health maintenance exam, FBW at time of visit.  ?   ? ? ? ? ?Carlean Jews, NP  ?[facility] ?[phone] (phone) ?[phone] (fax) ? ?[facility]  ?

## 2022-03-10 DIAGNOSIS — Z6832 Body mass index (BMI) 32.0-32.9, adult: Secondary | ICD-10-CM | POA: Insufficient documentation

## 2022-03-11 ENCOUNTER — Telehealth: Payer: Self-pay | Admitting: Nurse Practitioner

## 2022-03-11 NOTE — Telephone Encounter (Signed)
Yes. That is fine to do. If he could just remind me closer to the time of his visit, I will put the orders in. Please and thank you.

## 2022-03-11 NOTE — Telephone Encounter (Signed)
Patient has a follow up appt to see you in September and it says for fbw at the time of visit however he usually has it drawn at a [facility] facility ahead of time and wants to know if you can put in order for what he needs to have done. Please advise ?

## 2022-03-11 NOTE — Telephone Encounter (Signed)
Patient is aware 

## 2022-07-30 ENCOUNTER — Other Ambulatory Visit: Payer: Self-pay

## 2022-07-30 DIAGNOSIS — R000 Tachycardia, unspecified: Secondary | ICD-10-CM

## 2022-07-30 MED ORDER — METOPROLOL TARTRATE 25 MG PO TABS: ORAL_TABLET | ORAL | 5 refills | Status: DC

## 2022-08-20 ENCOUNTER — Encounter: Payer: Self-pay | Admitting: Nurse Practitioner

## 2022-08-22 ENCOUNTER — Ambulatory Visit
Admission: RE | Admit: 2022-08-22 | Discharge: 2022-08-22 | Disposition: A | Discharge status: Home / Self Care | Payer: Self-pay | Source: Ambulatory Visit | Attending: Nurse Practitioner | Admitting: Nurse Practitioner

## 2022-08-22 ENCOUNTER — Ambulatory Visit
Admission: RE | Admit: 2022-08-22 | Discharge: 2022-08-22 | Disposition: A | Discharge status: Home / Self Care | Payer: Self-pay | Attending: Nurse Practitioner | Admitting: Nurse Practitioner

## 2022-08-22 ENCOUNTER — Ambulatory Visit (INDEPENDENT_AMBULATORY_CARE_PROVIDER_SITE_OTHER): Payer: Self-pay | Admitting: Nurse Practitioner

## 2022-08-22 ENCOUNTER — Encounter: Payer: Self-pay | Admitting: Nurse Practitioner

## 2022-08-22 VITALS — BP 130/84 | HR 92 | Ht 68.9 in | Wt 221.2 lb

## 2022-08-22 DIAGNOSIS — M79641 Pain in right hand: Secondary | ICD-10-CM

## 2022-08-22 DIAGNOSIS — I10 Essential (primary) hypertension: Secondary | ICD-10-CM

## 2022-08-22 DIAGNOSIS — R5383 Other fatigue: Secondary | ICD-10-CM

## 2022-08-22 DIAGNOSIS — R000 Tachycardia, unspecified: Secondary | ICD-10-CM

## 2022-08-22 DIAGNOSIS — E785 Hyperlipidemia, unspecified: Secondary | ICD-10-CM

## 2022-08-22 DIAGNOSIS — M2550 Pain in unspecified joint: Secondary | ICD-10-CM

## 2022-08-22 DIAGNOSIS — Z0000 Encounter for general adult medical examination without abnormal findings: Secondary | ICD-10-CM

## 2022-08-22 DIAGNOSIS — F5101 Primary insomnia: Secondary | ICD-10-CM

## 2022-08-22 MED ORDER — ZOLPIDEM TARTRATE 10 MG PO TABS: ORAL_TABLET | ORAL | 5 refills | Status: DC

## 2022-08-22 MED ORDER — METOPROLOL TARTRATE 25 MG PO TABS: ORAL_TABLET | ORAL | 3 refills | Status: DC

## 2022-08-22 MED ORDER — PREDNISONE 10 MG (48) PO TBPK: ORAL_TABLET | ORAL | 0 refills | Status: DC

## 2022-08-22 NOTE — Progress Notes (Signed)
Established patient visit   Patient: Daniel Jimenez   DOB: 05-08-1976   46 y.o. Male  MRN: [id number] Visit Date: 08/22/2022  Chief Complaint  Patient presents with   Hand Pain   Subjective    HPI  Right hand pain.  -proximal joints of first two fingers  -hard to make a fist. Grips are weak. Knuckles are swollen.  -prednisone has helped.   Medications: Outpatient Medications Prior to Visit  Medication Sig   cyclobenzaprine (FLEXERIL) 10 MG tablet Take 1/2 to 1 tablet po QPM prn muscle pain   omeprazole (PRILOSEC) 40 MG capsule Take 40 mg by mouth daily.   [DISCONTINUED] metoprolol tartrate (LOPRESSOR) 25 MG tablet Take 1/2 tablet po BID   [DISCONTINUED] predniSONE (STERAPRED UNI-PAK 48 TAB) 10 MG (48) TBPK tablet 12 day taper - take by mouth as directed for 12 days   [DISCONTINUED] zolpidem (AMBIEN) 10 MG tablet Take 1 tab po at bedtime as needed for sleep   No facility-administered medications prior to visit.    Review of Systems  Constitutional:  Negative for activity change, chills, fatigue and fever.  HENT:  Negative for congestion, postnasal drip, rhinorrhea, sinus pressure, sinus pain, sneezing and sore throat.   Eyes: Negative.   Respiratory:  Negative for cough, shortness of breath and wheezing.   Cardiovascular:  Negative for chest pain and palpitations.  Gastrointestinal:  Negative for constipation, diarrhea, nausea and vomiting.  Endocrine: Negative for cold intolerance, heat intolerance, polydipsia and polyuria.  Genitourinary:  Negative for dysuria, frequency and urgency.  Musculoskeletal:  Positive for arthralgias and myalgias. Negative for back pain.       Generalized joint pain. This has been most severe in bilateral hands.   Skin:  Negative for rash.  Allergic/Immunologic: Negative for environmental allergies.  Neurological:  Negative for dizziness, weakness and headaches.  Psychiatric/Behavioral:  Positive for sleep disturbance. The patient is not  nervous/anxious.      Objective     Today's Vitals   08/22/22 0852  BP: 130/84  Pulse: 92  SpO2: 99%  Weight: 221 lb 3.2 oz (100.3 kg)  Height: 5' 8.9" (1.75 m)   Body mass index is 32.76 kg/m.   Physical Exam Vitals and nursing note reviewed.  Constitutional:      Appearance: Normal appearance. Daniel Jimenez is well-developed.  HENT:     Head: Normocephalic and atraumatic.     Nose: Nose normal.     Mouth/Throat:     Mouth: Mucous membranes are moist.     Pharynx: Oropharynx is clear.  Eyes:     Extraocular Movements: Extraocular movements intact.     Conjunctiva/sclera: Conjunctivae normal.     Pupils: Pupils are equal, round, and reactive to light.  Cardiovascular:     Rate and Rhythm: Normal rate and regular rhythm.     Pulses: Normal pulses.     Heart sounds: Normal heart sounds.  Pulmonary:     Effort: Pulmonary effort is normal.     Breath sounds: Normal breath sounds.  Abdominal:     Palpations: Abdomen is soft.  Musculoskeletal:        General: Normal range of motion.     Cervical back: Normal range of motion and neck supple.     Comments: Decreased grip strength and pain with usual ROM of bilateral hands. No swelling or joint deformities are noted at this time.   Lymphadenopathy:     Cervical: No cervical adenopathy.  Skin:    General: Skin is  warm and dry.     Capillary Refill: Capillary refill takes less than 2 seconds.  Neurological:     General: No focal deficit present.     Mental Status: Daniel Jimenez is alert and oriented to person, place, and time.  Psychiatric:        Mood and Affect: Mood normal.        Behavior: Behavior normal.        Thought Content: Thought content normal.        Judgment: Judgment normal.     Results for orders placed or performed in visit on 08/22/22  TSH + free T4  Result Value Ref Range   TSH 3.160 0.450 - 4.500 uIU/mL   Free T4 1.11 0.82 - 1.77 ng/dL  Lipid panel  Result Value Ref Range   Cholesterol, Total 203 (H) 100 - 199  mg/dL   Triglycerides 262 (H) 0 - 149 mg/dL   HDL 46 >39 mg/dL   VLDL Cholesterol Cal 45 (H) 5 - 40 mg/dL   LDL Chol Calc (NIH) 112 (H) 0 - 99 mg/dL   Chol/HDL Ratio 4.4 0.0 - 5.0 ratio  Comprehensive metabolic panel  Result Value Ref Range   Glucose 96 70 - 99 mg/dL   BUN 20 6 - 24 mg/dL   Creatinine, Ser 0.86 0.76 - 1.27 mg/dL   eGFR 109 >59 mL/min/1.73   BUN/Creatinine Ratio 23 (H) 9 - 20   Sodium 140 134 - 144 mmol/L   Potassium 3.9 3.5 - 5.2 mmol/L   Chloride 100 96 - 106 mmol/L   CO2 24 20 - 29 mmol/L   Calcium 9.5 8.7 - 10.2 mg/dL   Total Protein 7.0 6.0 - 8.5 g/dL   Albumin 4.9 4.1 - 5.1 g/dL   Globulin, Total 2.1 1.5 - 4.5 g/dL   Albumin/Globulin Ratio 2.3 (H) 1.2 - 2.2   Bilirubin Total 0.4 0.0 - 1.2 mg/dL   Alkaline Phosphatase 73 44 - 121 IU/L   AST 17 0 - 40 IU/L   ALT 24 0 - 44 IU/L  CBC  Result Value Ref Range   WBC 13.0 (H) 3.4 - 10.8 x10E3/uL   RBC 5.27 4.14 - 5.80 x10E6/uL   Hemoglobin 15.9 13.0 - 17.7 g/dL   Hematocrit 47.9 37.5 - 51.0 %   MCV 91 79 - 97 fL   MCH 30.2 26.6 - 33.0 pg   MCHC 33.2 31.5 - 35.7 g/dL   RDW 13.1 11.6 - 15.4 %   Platelets 286 150 - 450 x10E3/uL  Hemoglobin A1c  Result Value Ref Range   Hgb A1c MFr Bld 5.6 4.8 - 5.6 %   Est. average glucose Bld gHb Est-mCnc 114 mg/dL    Assessment & Plan    1. Generalized joint pain Prednisone taper ordered.  Take as directed for 12 days.  Continue use of over-the-counter Tylenol and ibuprofen as needed and as indicated for pain and inflammation relief. - predniSONE (STERAPRED UNI-PAK 48 TAB) 10 MG (48) TBPK tablet; 12 day taper - take by mouth as directed for 12 days  Dispense: 48 tablet; Refill: 0  2. Right hand pain Prednisone taper ordered.  Take as directed for 12 days.  Continue use of over-the-counter Tylenol and ibuprofen as needed and as indicated for pain and inflammation relief.  X-ray of right hand ordered for further evaluation. - predniSONE (STERAPRED UNI-PAK 48 TAB) 10 MG  (48) TBPK tablet; 12 day taper - take by mouth as directed for 12 days  Dispense: 48 tablet; Refill: 0 - DG Hand Complete Right; Future  3. Essential hypertension Blood pressure stable.  Continue Toprol as previously prescribed. - Comprehensive metabolic panel - CBC  4. Tachycardia Stable.  Continue Toprol as previously prescribed. - metoprolol tartrate (LOPRESSOR) 25 MG tablet; Take 1/2 tablet po BID  Dispense: 90 tablet; Refill: 3  5. Hyperlipidemia LDL goal <100 Fasting lipids ordered during today's visit. - TSH + free T4 - Lipid panel - Comprehensive metabolic panel - CBC  6. Primary insomnia May continue Ambien 10 mg as needed for sleep.  New prescription sent to his pharmacy. - zolpidem (AMBIEN) 10 MG tablet; Take 1 tab po at bedtime as needed for sleep  Dispense: 30 tablet; Refill: 5  7. Other fatigue Check thyroid panel and hemoglobin A1c for further evaluation. - TSH + free T4 - Hemoglobin A1c  8. Healthcare maintenance Routine, fasting labs ordered during today's visit.  - TSH + free T4 - Lipid panel - Comprehensive metabolic panel - CBC - Hemoglobin A1c   Return for as scheduled, health maintenance exam.        Ronnell Freshwater, NP  [place] [facility] [phone] (phone) [phone] (fax)  [place]

## 2022-08-27 LAB — COMPREHENSIVE METABOLIC PANEL
ALT: 24 IU/L (ref 0–44)
AST: 17 IU/L (ref 0–40)
Albumin/Globulin Ratio: 2.3 — ABNORMAL HIGH (ref 1.2–2.2)
Albumin: 4.9 g/dL (ref 4.1–5.1)
Alkaline Phosphatase: 73 IU/L (ref 44–121)
BUN/Creatinine Ratio: 23 — ABNORMAL HIGH (ref 9–20)
BUN: 20 mg/dL (ref 6–24)
Bilirubin Total: 0.4 mg/dL (ref 0.0–1.2)
CO2: 24 mmol/L (ref 20–29)
Calcium: 9.5 mg/dL (ref 8.7–10.2)
Chloride: 100 mmol/L (ref 96–106)
Creatinine, Ser: 0.86 mg/dL (ref 0.76–1.27)
Globulin, Total: 2.1 g/dL (ref 1.5–4.5)
Glucose: 96 mg/dL (ref 70–99)
Potassium: 3.9 mmol/L (ref 3.5–5.2)
Sodium: 140 mmol/L (ref 134–144)
Total Protein: 7 g/dL (ref 6.0–8.5)
eGFR: 109 mL/min/{1.73_m2} (ref >59)

## 2022-08-27 LAB — TSH+FREE T4
Free T4: 1.11 ng/dL (ref 0.82–1.77)
TSH: 3.16 u[IU]/mL (ref 0.450–4.500)

## 2022-08-27 LAB — HEMOGLOBIN A1C
Est. average glucose Bld gHb Est-mCnc: 114 mg/dL
Hgb A1c MFr Bld: 5.6 % (ref 4.8–5.6)

## 2022-08-27 LAB — LIPID PANEL
Chol/HDL Ratio: 4.4 ratio (ref 0.0–5.0)
Cholesterol, Total: 203 mg/dL — ABNORMAL HIGH (ref 100–199)
HDL: 46 mg/dL (ref >39)
LDL Chol Calc (NIH): 112 mg/dL — ABNORMAL HIGH (ref 0–99)
Triglycerides: 262 mg/dL — ABNORMAL HIGH (ref 0–149)
VLDL Cholesterol Cal: 45 mg/dL — ABNORMAL HIGH (ref 5–40)

## 2022-08-27 LAB — CBC
Hematocrit: 47.9 % (ref 37.5–51.0)
Hemoglobin: 15.9 g/dL (ref 13.0–17.7)
MCH: 30.2 pg (ref 26.6–33.0)
MCHC: 33.2 g/dL (ref 31.5–35.7)
MCV: 91 fL (ref 79–97)
Platelets: 286 10*3/uL (ref 150–450)
RBC: 5.27 x10E6/uL (ref 4.14–5.80)
RDW: 13.1 % (ref 11.6–15.4)
WBC: 13 10*3/uL — ABNORMAL HIGH (ref 3.4–10.8)

## 2022-09-05 ENCOUNTER — Encounter: Payer: Self-pay | Admitting: Nurse Practitioner

## 2022-09-08 DIAGNOSIS — R5383 Other fatigue: Secondary | ICD-10-CM | POA: Insufficient documentation

## 2022-09-08 DIAGNOSIS — E785 Hyperlipidemia, unspecified: Secondary | ICD-10-CM | POA: Insufficient documentation

## 2022-09-17 DIAGNOSIS — Z1211 Encounter for screening for malignant neoplasm of colon: Secondary | ICD-10-CM

## 2022-09-23 ENCOUNTER — Encounter: Payer: Self-pay | Admitting: Nurse Practitioner

## 2022-10-24 ENCOUNTER — Other Ambulatory Visit: Payer: Self-pay | Admitting: Nurse Practitioner

## 2022-10-24 DIAGNOSIS — M2550 Pain in unspecified joint: Secondary | ICD-10-CM

## 2022-11-22 ENCOUNTER — Encounter: Payer: Self-pay | Admitting: Nurse Practitioner

## 2023-03-03 ENCOUNTER — Other Ambulatory Visit: Payer: Self-pay | Admitting: Nurse Practitioner

## 2023-03-03 DIAGNOSIS — M79641 Pain in right hand: Secondary | ICD-10-CM

## 2023-03-03 DIAGNOSIS — M2550 Pain in unspecified joint: Secondary | ICD-10-CM

## 2023-03-04 ENCOUNTER — Other Ambulatory Visit: Payer: Self-pay | Admitting: Nurse Practitioner

## 2023-03-04 DIAGNOSIS — F5101 Primary insomnia: Secondary | ICD-10-CM

## 2023-03-04 MED ORDER — ZOLPIDEM TARTRATE 10 MG PO TABS: ORAL_TABLET | ORAL | 5 refills | Status: DC

## 2023-09-11 ENCOUNTER — Ambulatory Visit (INDEPENDENT_AMBULATORY_CARE_PROVIDER_SITE_OTHER): Payer: Self-pay | Admitting: Family Medicine

## 2023-09-11 ENCOUNTER — Encounter: Payer: Self-pay | Admitting: Family Medicine

## 2023-09-11 VITALS — BP 120/88 | HR 95 | Ht 68.9 in | Wt 217.1 lb

## 2023-09-11 DIAGNOSIS — E785 Hyperlipidemia, unspecified: Secondary | ICD-10-CM

## 2023-09-11 DIAGNOSIS — Z1212 Encounter for screening for malignant neoplasm of rectum: Secondary | ICD-10-CM

## 2023-09-11 DIAGNOSIS — I10 Essential (primary) hypertension: Secondary | ICD-10-CM

## 2023-09-11 DIAGNOSIS — G5782 Other specified mononeuropathies of left lower limb: Secondary | ICD-10-CM | POA: Insufficient documentation

## 2023-09-11 DIAGNOSIS — M2550 Pain in unspecified joint: Secondary | ICD-10-CM

## 2023-09-11 DIAGNOSIS — L219 Seborrheic dermatitis, unspecified: Secondary | ICD-10-CM | POA: Insufficient documentation

## 2023-09-11 DIAGNOSIS — L570 Actinic keratosis: Secondary | ICD-10-CM | POA: Insufficient documentation

## 2023-09-11 DIAGNOSIS — R000 Tachycardia, unspecified: Secondary | ICD-10-CM

## 2023-09-11 DIAGNOSIS — Z1211 Encounter for screening for malignant neoplasm of colon: Secondary | ICD-10-CM

## 2023-09-11 DIAGNOSIS — F5101 Primary insomnia: Secondary | ICD-10-CM

## 2023-09-11 DIAGNOSIS — D72829 Elevated white blood cell count, unspecified: Secondary | ICD-10-CM

## 2023-09-11 MED ORDER — KETOCONAZOLE 2 % EX SHAM: 1.0000 | MEDICATED_SHAMPOO | CUTANEOUS | 1 refills | Status: DC

## 2023-09-11 MED ORDER — METOPROLOL TARTRATE 25 MG PO TABS: ORAL_TABLET | ORAL | 3 refills | Status: AC

## 2023-09-11 MED ORDER — ZOLPIDEM TARTRATE 10 MG PO TABS: ORAL_TABLET | ORAL | 5 refills | Status: DC

## 2023-09-11 MED ORDER — PREDNISONE 10 MG (48) PO TBPK
ORAL_TABLET | ORAL | 0 refills | Status: DC
Start: 2023-09-11 — End: 2024-03-11

## 2023-09-11 NOTE — Assessment & Plan Note (Signed)
Not on statin therapy.  Previous LDL was elevated.  Recheck today. The 10-year ASCVD risk score (Arnett DK, et al., 2019) is: 2.7%

## 2023-09-11 NOTE — Assessment & Plan Note (Signed)
Nasolabial scaling with complaints of itchiness on the face, eye brows and hair with dandruff.  Currently using salicylic acid shampoo.  Recommended ketoconazole 2% shampoo.  Send in prescription for patient to use on the face and hair daily and then transition to twice weekly maintenance dose if symptoms resolve.

## 2023-09-11 NOTE — Assessment & Plan Note (Signed)
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  He has tried several treatment options and has seen multiple specialist.  So far he has tried opioids, muscle relaxants, NSAIDs, topical therapies/anesthetics, localized joint injections, fluid aspiration, acupuncture, massage therapy, chiropractic therapy.  Nothing offers sustained or long-lasting relief.  The treatment that has the longest affect is short courses of prednisone.  We discussed the consequences of prolonged or repeated steroid therapy.  Will give refill of his steroid taper today.  Would recommend repeating this no more than 2-3 times a year.  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

## 2023-09-11 NOTE — Assessment & Plan Note (Signed)
Continue zolpidem nightly.  Patient tolerating medicine well.  No concerns.

## 2023-09-11 NOTE — Assessment & Plan Note (Signed)
Lesions on patient's forearms are most likely actinic keratoses.  Advised patient he can reach out to his dermatologist to see if they can schedule him for evaluation.  Advised if they need a new referral we can send it in or he can come to [place] for biopsy and then subsequently cryotherapy on the lesion.  Recommended continued use of sunscreen.
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
## 2023-09-11 NOTE — Assessment & Plan Note (Addendum)
Patient takes metoprolol tartrate 25 mg, 1/2 tablet twice a day.  Tolerates well.  Continue current dose.  Blood pressure good today.

## 2023-09-11 NOTE — Patient Instructions (Signed)
It was nice to see you today,  We addressed the following topics today: -I will send in a prescription for Nizoral 2% shampoo.  Use this on your hair and face every day for 2 weeks and if your rash improves then you can switch to using it twice a week for maintenance. - I will send in a steroid prescription for your pain.  We can do this a few times a year but we should not be doing it on a consistent basis. - Call your dermatologist and see if they can see you again.  If they need a new referral or if you would like [place] to do the biopsy or cryotherapy let us know. - I will see you in 6 months or sooner if needed.  Have a great day,  Frederic Jericho, MD

## 2023-09-11 NOTE — Assessment & Plan Note (Signed)
Patient complains of burning sensation periodically starting in the medial aspect of the left knee that radiates down the medial shin, sometimes radiates upwards in the medial thigh.  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

## 2023-09-12 LAB — CBC WITH DIFFERENTIAL/PLATELET
Basophils Absolute: 0.1 10*3/uL (ref 0.0–0.2)
Basos: 1 %
EOS (ABSOLUTE): 0.2 10*3/uL (ref 0.0–0.4)
Eos: 3 %
Hematocrit: 48.4 % (ref 37.5–51.0)
Hemoglobin: 16.6 g/dL (ref 13.0–17.7)
Immature Grans (Abs): 0 10*3/uL (ref 0.0–0.1)
Immature Granulocytes: 0 %
Lymphocytes Absolute: 2.4 10*3/uL (ref 0.7–3.1)
Lymphs: 34 %
MCH: 31.7 pg (ref 26.6–33.0)
MCHC: 34.3 g/dL (ref 31.5–35.7)
MCV: 93 fL (ref 79–97)
Monocytes Absolute: 0.7 10*3/uL (ref 0.1–0.9)
Monocytes: 11 %
Neutrophils Absolute: 3.6 10*3/uL (ref 1.4–7.0)
Neutrophils: 51 %
Platelets: 267 10*3/uL (ref 150–450)
RBC: 5.23 x10E6/uL (ref 4.14–5.80)
RDW: 12.4 % (ref 11.6–15.4)
WBC: 6.9 10*3/uL (ref 3.4–10.8)

## 2023-09-12 LAB — LIPID PANEL
Chol/HDL Ratio: 5.7 {ratio} — ABNORMAL HIGH (ref 0.0–5.0)
Cholesterol, Total: 198 mg/dL (ref 100–199)
HDL: 35 mg/dL — ABNORMAL LOW (ref >39)
LDL Chol Calc (NIH): 115 mg/dL — ABNORMAL HIGH (ref 0–99)
Triglycerides: 276 mg/dL — ABNORMAL HIGH (ref 0–149)
VLDL Cholesterol Cal: 48 mg/dL — ABNORMAL HIGH (ref 5–40)

## 2024-03-11 ENCOUNTER — Encounter: Payer: Self-pay | Admitting: Family Medicine

## 2024-03-11 ENCOUNTER — Ambulatory Visit (INDEPENDENT_AMBULATORY_CARE_PROVIDER_SITE_OTHER): Payer: Self-pay | Admitting: Family Medicine

## 2024-03-11 VITALS — BP 108/69 | HR 95 | Ht 68.9 in | Wt 223.8 lb

## 2024-03-11 DIAGNOSIS — M6790 Unspecified disorder of synovium and tendon, unspecified site: Secondary | ICD-10-CM

## 2024-03-11 DIAGNOSIS — M2550 Pain in unspecified joint: Secondary | ICD-10-CM

## 2024-03-11 DIAGNOSIS — F5101 Primary insomnia: Secondary | ICD-10-CM

## 2024-03-11 DIAGNOSIS — G5782 Other specified mononeuropathies of left lower limb: Secondary | ICD-10-CM

## 2024-03-11 MED ORDER — PREDNISONE 10 MG (48) PO TBPK: ORAL_TABLET | ORAL | 0 refills | Status: AC

## 2024-03-11 MED ORDER — CELECOXIB 200 MG PO CAPS: 200.0000 mg | ORAL_CAPSULE | Freq: Every day | ORAL | 1 refills | Status: DC

## 2024-03-11 MED ORDER — ZOLPIDEM TARTRATE 10 MG PO TABS: ORAL_TABLET | ORAL | 5 refills | Status: DC

## 2024-03-11 NOTE — Assessment & Plan Note (Addendum)
 Would still favor generalized osteoarthritis given his extensive workup in the past but I cannot see any of the rheumatology notes.  I believe it is [facility] in [place] so I will send in records request to them before we decide on any further laboratory evaluation of inflammatory arthritis.  Sending in Celebrex 200 daily to try instead of naproxen.  He uses naproxen essentially every day.  Refilled prednisone which she uses responsibly, a few times per year for severe issues.

## 2024-03-11 NOTE — Assessment & Plan Note (Signed)
 This still sounds like the most likely explanation for the burning sensation he has medially on the left tibia and distally.  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  He is self-pay so any sort of procedure with orthopedics or further workup involving MRI or nerve conduction studies may not be affordable.  Sending to sports medicine to see if there are any other treatment options he has not tried yet for this and his other complaints of pain.

## 2024-03-11 NOTE — Progress Notes (Signed)
 Established Patient Office Visit  Subjective   Patient ID: Daniel Jimenez, male    DOB: [birth year]  Age: 48 y.o. MRN: [id number]  No chief complaint on file.   HPI  Chronic pain-patient having the same chronic pains that have bothered him for several years and which has been worked up extensively.  This includes knee pain and small joint pain.  In addition to the joint pain he has a "burning" feeling in the medial aspect of the knees that radiates downward and somewhat proximally as well.  We discussed the possibility of ruling out rheumatoid arthritis but he has had a rheumatology visit in the past that we do not have notes for.   We discussed how his burning sensation in the knees is likely a nerve impingement that would require a procedure to treat and not oral medications.  Offered referral to sports medicine to see if shockwave therapy would be of benefit to any of his chronic pains given that he has tried every other modality in the past.  He is okay with trying Celebrex instead of the naproxen.  He would like a refill of the prednisone which she takes sparingly and seems to knock out his pain when it flares up in about a day.  Patient also complaining of a nodule on the area of the proximal phalanges of the fourth finger on the left hand.    The 10-year ASCVD risk score (Arnett DK, et al., 2019) is: 3%  Health Maintenance Due  Topic Date Due   COVID-19 Vaccine (1) Never done   HIV Screening  Never done   DTaP/Tdap/Td (1 - Tdap) Never done   Colonoscopy  Never done      Objective:     BP 108/69   Pulse 95   Ht 5' 8.9" (1.75 m)   Wt 223 lb 12 oz (101.5 kg)   SpO2 100%   BMI 33.14 kg/m    Physical Exam General: Alert, oriented MSK: Mild medial swelling of the left knee joint space.  Otherwise normal exam.  Nodule in the proximal phalanges of the left ring finger   No results found for any visits on 03/11/24.      Assessment & Plan:   Generalized joint  pain Assessment & Plan: Would still favor generalized osteoarthritis given his extensive workup in the past but I cannot see any of the rheumatology notes.  I believe it is [facility] in [place] so I will send in records request to them before we decide on any further laboratory evaluation of inflammatory arthritis.  Sending in Celebrex 200 daily to try instead of naproxen.  He uses naproxen essentially every day.  Refilled prednisone which she uses responsibly, a few times per year for severe issues.  Orders: -     Ambulatory referral to Sports Medicine -     predniSONE; 12 day taper - take by mouth as directed for 12 days  Dispense: 48 tablet; Refill: 0  Primary insomnia -     Zolpidem Tartrate; Take 1 tab po at bedtime as needed for sleep  Dispense: 30 tablet; Refill: 5  Saphenous nerve neuropathy, left Assessment & Plan: This still sounds like the most likely explanation for the burning sensation he has medially on the left tibia and distally.  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  He is self-pay so any sort of procedure with orthopedics or further workup involving MRI or  nerve conduction studies may not be affordable.  Sending to sports medicine to see if there are any other treatment options he has not tried yet for this and his other complaints of pain.   Tendon nodule  Other orders -     Celecoxib; Take 1 capsule (200 mg total) by mouth daily.  Dispense: 30 capsule; Refill: 1     Return in about 2 months (around 05/11/2024) for msk.    Sandre Kitty, MD

## 2024-03-11 NOTE — Patient Instructions (Addendum)
 It was nice to see you today,  We addressed the following topics today: -I am sending in a prescription for Celebrex.  Take this once a day instead of your Aleve or ibuprofen - I am sending in a referral for sports medicine.  They can evaluate your musculoskeletal complaints and see if shockwave therapy would be appropriate. - Someone will call you to schedule the referral.  If you have not heard from somebody in 3 weeks let us know - I have sent in refills of your Ambien and steroid.   Have a great day,  Frederic Jericho, MD

## 2024-03-23 ENCOUNTER — Telehealth: Payer: Self-pay | Admitting: *Deleted

## 2024-03-23 ENCOUNTER — Other Ambulatory Visit: Payer: Self-pay | Admitting: Family Medicine

## 2024-03-23 DIAGNOSIS — F5101 Primary insomnia: Secondary | ICD-10-CM

## 2024-03-23 NOTE — Telephone Encounter (Signed)
 Copied from CRM [id number]. Topic: Clinical - Prescription Issue >> Mar 23, 2024  4:07 PM Sasha H wrote: Reason for CRM: pt called in wanting to know why his zolpidem (AMBIEN) 10 MG tablet was denied by the pharmacy when the doctor sent in the refill when he was in the office recently.

## 2024-05-12 ENCOUNTER — Ambulatory Visit (INDEPENDENT_AMBULATORY_CARE_PROVIDER_SITE_OTHER): Payer: Self-pay | Admitting: Family Medicine

## 2024-05-12 ENCOUNTER — Encounter: Payer: Self-pay | Admitting: Family Medicine

## 2024-05-12 VITALS — BP 123/86 | HR 85 | Ht 70.5 in | Wt 223.8 lb

## 2024-05-12 DIAGNOSIS — G5782 Other specified mononeuropathies of left lower limb: Secondary | ICD-10-CM

## 2024-05-12 DIAGNOSIS — Z5971 Insufficient health insurance coverage: Secondary | ICD-10-CM

## 2024-05-12 DIAGNOSIS — L989 Disorder of the skin and subcutaneous tissue, unspecified: Secondary | ICD-10-CM

## 2024-05-12 DIAGNOSIS — L219 Seborrheic dermatitis, unspecified: Secondary | ICD-10-CM

## 2024-05-12 MED ORDER — KETOCONAZOLE 2 % EX SHAM: 1.0000 | MEDICATED_SHAMPOO | CUTANEOUS | 2 refills | Status: AC
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
## 2024-05-12 NOTE — Patient Instructions (Addendum)
 It was nice to see you today,  We addressed the following topics today: -Someone will call you about a referral to discuss resources to help make insurance or your medications more affordable. - You can schedule a visit with me to do a knee injection.  I would recommend at some point seeing a physical therapist or sports medicine provider to discuss other options like dry needling or shockwave therapy. - Continue taking your Celebrex . - I have sent in a refill of your ketoconazole  2% shampoo to use on your seborrheic dermatitis.  There is a 1% shampoo that is available over-the-counter that might help if you would like to try that instead.  Okay  Have a great day,  Etha Henle, MD

## 2024-05-14 ENCOUNTER — Ambulatory Visit (INDEPENDENT_AMBULATORY_CARE_PROVIDER_SITE_OTHER): Payer: Self-pay | Admitting: Family Medicine

## 2024-05-14 ENCOUNTER — Encounter: Payer: Self-pay | Admitting: Family Medicine

## 2024-05-14 ENCOUNTER — Ambulatory Visit: Payer: Self-pay | Admitting: Family Medicine

## 2024-05-14 VITALS — BP 127/85 | HR 86 | Ht 70.5 in | Wt 224.0 lb

## 2024-05-14 DIAGNOSIS — L989 Disorder of the skin and subcutaneous tissue, unspecified: Secondary | ICD-10-CM | POA: Insufficient documentation

## 2024-05-14 DIAGNOSIS — M25562 Pain in left knee: Secondary | ICD-10-CM

## 2024-05-14 NOTE — Assessment & Plan Note (Signed)
 Right knee pain with possible saphenous nerve entrapment - Symptoms include burning sensation, swelling, pain radiating down shin - Previous knee injection provided temporary relief - Prednisone  effective but not sustainable long-term - Celebrex  helping with hand pain but not knee - TENS unit tried without benefit - Discussed options:   - Medial knee steroid injection   - Dry needling (would require PT referral)   - Shockwave therapy (available at [facility])   - Stretching exercises for groin muscles

## 2024-05-14 NOTE — Assessment & Plan Note (Signed)
-   New lesion noted on face, possibly AK - Plan to freeze lesions at next visit. Consider shave biopsy prior to cryotherapy

## 2024-05-14 NOTE — Assessment & Plan Note (Signed)
-   Using ketoconazole  shampoo 2-3 times weekly - Refilled ketoconazole  2% shampoo - Recommended OTC cortisone cream for possible eczema component - Suggested moisturizing creams after shower

## 2024-05-17 ENCOUNTER — Telehealth: Payer: Self-pay

## 2024-05-17 DIAGNOSIS — M25562 Pain in left knee: Secondary | ICD-10-CM

## 2024-05-17 MED ORDER — METHYLPREDNISOLONE ACETATE 80 MG/ML IJ SUSP
80.0000 mg | Freq: Once | INTRAMUSCULAR | Status: AC
Start: 2024-05-17 — End: 2024-05-17
  Administered 2024-05-17: 80 mg via INTRAMUSCULAR

## 2024-05-17 NOTE — Progress Notes (Signed)
 Joint Injection/Arthrocentesis  Date/Time: 05/17/2024 7:42 AM  Performed by: Laneta Pintos, MD Authorized by: Laneta Pintos, MD  Indications: pain  Body area: knee Joint: left knee Local anesthesia used: no  Anesthesia: Local anesthesia used: no  Sedation: Patient sedated: no  Preparation: Patient was prepped and draped in the usual sterile fashion. Needle gauge: 25g. Ultrasound guidance: no Approach: anteromedial. Methylprednisolone amount: 80 mg Lidocaine 2% amount: 2 mL Patient tolerance: patient tolerated the procedure well with no immediate complications    Cryotherapy Procedure Note  Pre-operative Diagnosis: Actinic keratosis  Post-operative Diagnosis: Actinic keratosis  Locations: left, right forearm  Indications: treatment of actinic keratosis.  Pt declined shave biopsy  Anesthesia: not required    Procedure Details  History of allergy to iodine: no. Pacemaker? no.  Patient informed of risks (permanent scarring, infection, light or dark discoloration, bleeding, infection, weakness, numbness and recurrence of the lesion) and benefits of the procedure and written informed consent obtained.  The areas are treated with liquid nitrogen therapy, frozen until ice ball extended 5 mm beyond lesion, allowed to thaw, and treated again. The patient tolerated procedure well.  The patient was instructed on post-op care, warned that there may be blister formation, redness and pain. Recommend OTC analgesia as needed for pain.  Condition: Stable  Complications: none.  Plan: 1. Instructed to keep the area dry and covered for 24-48h and clean thereafter. 2. Warning signs of infection were reviewed.   3. Recommended that the patient use OTC analgesics as needed for pain.  4. Return in 3 month.

## 2024-05-17 NOTE — Progress Notes (Signed)
 Complex Care Management Note  Care Guide Note 05/17/2024 Name: DUARD SPIEWAK MRN: [id number] DOB: [birth year]  Jalaine Mayo is a 48 y.o. year old male who sees Laneta Pintos, MD for primary care. I reached out to Jalaine Mayo by phone today to offer complex care management services.  Mr. Zatarain was given information about Complex Care Management services today including:   The Complex Care Management services include support from the care team which includes your Nurse Care Manager, Clinical Social Worker, or Pharmacist.  The Complex Care Management team is here to help remove barriers to the health concerns and goals most important to you. Complex Care Management services are voluntary, and the patient may decline or stop services at any time by request to their care team member.   Complex Care Management Consent Status: Patient agreed to services and verbal consent obtained.   Follow up plan:  Telephone appointment with complex care management team member scheduled for:  Pharm d 05/20/2024 LCSW 06/03/2024  Encounter Outcome:  Patient Scheduled  Lenton Rail , RMA     [place]  [facility], [facility] Guide  Direct Dial: [phone]  Website: [email or web address]

## 2024-05-18 ENCOUNTER — Other Ambulatory Visit: Payer: Self-pay | Admitting: Family Medicine

## 2024-05-20 ENCOUNTER — Telehealth: Payer: Self-pay

## 2024-05-20 ENCOUNTER — Other Ambulatory Visit: Payer: Self-pay

## 2024-05-20 NOTE — Progress Notes (Signed)
   05/20/2024  Patient ID: Daniel Jimenez, male   DOB: [birth year], 48 y.o.   MRN: [id number]  Attempted to contact patient for scheduled appointment for medication management. Left HIPAA compliant message for patient to return my call at their convenience.   Reviewed prescription pricing information: --Good Rx: zolpidem  $19.93 for 30 DS at [facility], celecoxib  $24.81 at [facility] or $11.21 at [facility], metoprolol  tartrate 25mg  (30 tabs) $6.37 at [facility]  --[facility] ([facility]): metoprolol  tartrate $5, nzolpidem  or alternatives, no celecoxib  (but would have meloxicam for $5)  --[place] med assist (would need to make less than [phone] for 2 person house, meds free if on their list): metoprolol  tartrate is on the list, celecoxib  is NOT on list but meloxicam is, no zolpidem  on the list   If patient returns call I would be happy to review these options to help with medication related costs. Direct line is below.   Rolando Cliche, PharmD, BCGP Clinical Pharmacist  [phone]

## 2024-05-20 NOTE — Progress Notes (Signed)
 05/20/2024 Name: Daniel Jimenez MRN: [id number] DOB: [birth year]  Chief Complaint  Patient presents with   Medication Management    Daniel Jimenez is a 48 y.o. year old male who presented for a telephone visit.   They were referred to the pharmacist by their PCP for assistance in managing medication access.    Subjective:  Care Team: Primary Care Provider: Laneta Pintos, MD ; Next Scheduled Visit:  Future Appointments  Date Time Provider Department Center  06/03/2024  3:00 PM Michaelle Adolphus [place], [place] [facility] None  08/18/2024  8:30 AM Laneta Pintos, MD [facility] None     Medication Access/Adherence  Current Pharmacy:  [facility] [phone] [place], [place] - [address] AT [facility] OF [address] [place] [address] [place] [address] Phone: [phone] Fax: [phone]  [id number] - [place], [place] - [address] [place] [address] Phone: [phone] Fax: [phone]   Patient reports affordability concerns with their medications: No  Patient reports access/transportation concerns to their pharmacy: No  Patient reports adherence concerns with their medications:  No     Medication Management:  Current adherence strategy:    Patient reports Good adherence to medications  Patient reports the following barriers to adherence: none at this time. Finds costs of his medications reasonable, does not interfere with obtaining meds. Feels there was perhaps a miscommunication with getting scheduled with social worker, which he still has set up on the 26th.   Objective:  Lab Results  Component Value Date   HGBA1C 5.6 08/26/2022    Lab Results  Component Value Date   CREATININE 0.86 08/26/2022   BUN 20 08/26/2022   NA 140 08/26/2022   K 3.9 08/26/2022   CL 100 08/26/2022   CO2 24 08/26/2022    Lab Results  Component Value Date   CHOL 198 09/11/2023   HDL 35 (L) 09/11/2023   LDLCALC 115 (H) 09/11/2023   TRIG 276 (H)  09/11/2023   CHOLHDL 5.7 (H) 09/11/2023    Medications Reviewed Today     Reviewed by Rolando Cliche, [facility] (Pharmacist) on 05/20/24 at 1616  Med List Status: <None>   Medication Order Taking? Sig Documenting Provider Last Dose Status Informant  celecoxib  (CELEBREX ) 200 MG capsule [id number]  TAKE 1 CAPSULE(200 MG) BY MOUTH DAILY Laneta Pintos, MD  Active   cyclobenzaprine  (FLEXERIL ) 10 MG tablet [id number] No TAKE 1/2 TO 1 TABLET BY MOUTH EVERY EVENING AS NEEDED FOR MUSCLE PAIN Boscia, Heather E, NP Taking Active   ketoconazole  (NIZORAL ) 2 % shampoo [id number] No Apply 1 Application topically 2 (two) times a week. Laneta Pintos, MD Taking Active   metoprolol  tartrate (LOPRESSOR ) 25 MG tablet [id number] No Take 1/2 tablet po BID Laneta Pintos, MD Taking Active   predniSONE  (STERAPRED UNI-PAK 48 TAB) 10 MG (48) TBPK tablet [id number] No 12 day taper - take by mouth as directed for 12 days Laneta Pintos, MD Taking Active   zolpidem  (AMBIEN ) 10 MG tablet [id number] No Take 1 tab po at bedtime as needed for sleep Laneta Pintos, MD Taking Active               Assessment/Plan:   Medication Management: - Currently strategy sufficient to maintain appropriate adherence to prescribed medication regimen - Reviewed options for Rx financial assistance including [facility] outpt pharmacies, goodrx, [facility], or PAP should there be a need  in the future. - Cost alternatives noted in my telephone encounter 05/09/2024.    Follow Up Plan: we agreed to follow-up PRN at this time.   Rolando Cliche, PharmD, BCGP Clinical Pharmacist  [phone]

## 2024-06-03 ENCOUNTER — Other Ambulatory Visit: Payer: Self-pay | Admitting: Licensed Clinical Social Worker

## 2024-06-03 NOTE — Patient Outreach (Signed)
Called pt; left message requesting call back

## 2024-08-18 ENCOUNTER — Encounter: Payer: Self-pay | Admitting: Family Medicine

## 2024-08-18 ENCOUNTER — Other Ambulatory Visit: Payer: Self-pay | Admitting: Family Medicine

## 2024-08-18 ENCOUNTER — Ambulatory Visit (INDEPENDENT_AMBULATORY_CARE_PROVIDER_SITE_OTHER): Payer: Self-pay | Admitting: Family Medicine

## 2024-08-18 VITALS — BP 123/83 | HR 81 | Ht 70.5 in | Wt 224.0 lb

## 2024-08-18 DIAGNOSIS — E785 Hyperlipidemia, unspecified: Secondary | ICD-10-CM

## 2024-08-18 DIAGNOSIS — N529 Male erectile dysfunction, unspecified: Secondary | ICD-10-CM

## 2024-08-18 DIAGNOSIS — F5101 Primary insomnia: Secondary | ICD-10-CM

## 2024-08-18 DIAGNOSIS — I10 Essential (primary) hypertension: Secondary | ICD-10-CM

## 2024-08-18 DIAGNOSIS — R5383 Other fatigue: Secondary | ICD-10-CM

## 2024-08-18 DIAGNOSIS — M2550 Pain in unspecified joint: Secondary | ICD-10-CM

## 2024-08-18 DIAGNOSIS — E291 Testicular hypofunction: Secondary | ICD-10-CM

## 2024-08-18 MED ORDER — ZOLPIDEM TARTRATE 10 MG PO TABS: ORAL_TABLET | ORAL | 5 refills | Status: AC

## 2024-08-18 MED ORDER — CELECOXIB 200 MG PO CAPS: ORAL_CAPSULE | ORAL | 1 refills | Status: AC

## 2024-08-18 MED ORDER — SILDENAFIL CITRATE 100 MG PO TABS: 50.0000 mg | ORAL_TABLET | Freq: Every day | ORAL | 1 refills | Status: AC | PRN

## 2024-08-18 NOTE — Progress Notes (Signed)
 Established Patient Office Visit  Subjective   Patient ID: Daniel Jimenez, male    DOB: May 02, 1976  Age: 48 y.o. MRN: [id number]  Chief Complaint  Patient presents with   Medical Management of Chronic Issues    HPI  Subjective - Insomnia. Reports poor sleep despite taking Ambien . States it takes a long time to become effective. When it does work, sleeps for only 2-3 hours before waking to urinate and being unable to return to sleep. Reports significant daytime fatigue and feeling burned out by early afternoon. Denies falling asleep during the day but feels he could if he sat down. - Erectile dysfunction. Reports long-standing history of sexual dysfunction for approximately 10 years. Experiences difficulty achieving and maintaining an erection. Notes penile shrinkage. Reports performance anxiety. Previously tried Viagra , prescribed by a former provider, without effect; cost was also a barrier at the time. - Muscle pain. Reports chronic, tight muscles. Shoulder weakness and arm giving out at times. Shin muscle pain. Notes some relief after a massage four weeks ago. Uses a massage gun and performs stretching at home, which provides some benefit. - Co-morbid headache with ejaculation. Reports history of severe, throbbing headache at the back of the neck upon ejaculation. - Low back pain. History of low back pain with occasional sciatica. Has had L5-S1 injections in the past with temporary relief.  Medications Ambien  for sleep, reports it is not very effective. Celebrex  for pain, causes heartburn which is managed with occasional omeprazole . Prednisone  taken as needed for severe muscle pain flares, reports it is very effective. Prior use of Lunesta for sleep (stopped due to cost), topical testosterone gel (ineffective), and Viagra  (ineffective, high cost).  PMH, PSH, FH, Social Hx PMHx: Low back pain with sciatica, history of L5-S1 injections. History of low testosterone. History of benign  skin lesion cryotherapy. PSH: In-office sleep study in 2019 was normal. Social Hx: Self-pay patient. Works as a [employer]or, [employer].  ROS Constitutional: Positive for fatigue. GU: Positive for nocturia, erectile dysfunction. Negative for morning/nocturnal erections. MSK: Positive for diffuse muscle tightness and pain, shoulder weakness. Positive for low back pain. Neuro: Positive for occasional sciatica. Positive for severe headache with ejaculation. Negative for falling asleep during the day.    The 10-year ASCVD risk score (Arnett DK, et al., 2019) is: 3.7%  Health Maintenance Due  Topic Date Due   COVID-19 Vaccine (1) Never done   HIV Screening  Never done   DTaP/Tdap/Td (1 - Tdap) Never done   Hepatitis B Vaccines 19-59 Average Risk (1 of 3 - 19+ 3-dose series) Never done   Colonoscopy  Never done      Objective:     BP 123/83   Pulse 81   Ht 5' 10.5 (1.791 m)   Wt 224 lb (101.6 kg)   SpO2 99%   BMI 31.69 kg/m    Physical Exam Gen: alert, oriented Pulm: no resp distress Psych: pleasant affect   No results found for any visits on 08/18/24.      Assessment & Plan:   Primary insomnia Assessment & Plan: Insomnia. Chronic issue with poor sleep maintenance and daytime fatigue, with limited benefit from Ambien . Discussed other potential contributors to fatigue. - Plan to order labs to investigate other causes of fatigue: TSH, CBC, CMP, B12, iron studies, and testosterone. - Provided a lab order form to take to [facility]; counseled on calling to check self-pay pricing beforehand to avoid unexpected costs. - Recommended trying over-the-counter magnesium oxide 400mg  or melatonin 1 hour  before bed to aid sleep. - Refilled Ambien  prescription.  Orders: -     Zolpidem  Tartrate; Take 1 tab po at bedtime as needed for sleep  Dispense: 30 tablet; Refill: 5  Essential hypertension -     CBC with Differential/Platelet  Hyperlipidemia LDL goal <100 -     Comprehensive metabolic  panel with GFR -     Hemoglobin A1c -     Lipid panel  Generalized joint pain Assessment & Plan: Chronic diffuse pain and tightness, particularly in the back and legs, with some relief from massage, stretching, and a massage gun. Celebrex  provides partial relief. Prednisone  is effective for flares. - Continue current home management with stretching and massage gun. - Continue Celebrex  as scheduled. - Continue Prednisone  for flares only. - Refilled Celebrex  prescription.   Other fatigue -     CBC with Differential/Platelet -     Testosterone -     TSH -     Iron, TIBC and Ferritin Panel -     B12 and Folate Panel  Erectile dysfunction, unspecified erectile dysfunction type Assessment & Plan: Long-standing history, likely multifactorial including physical and psychological components. Prior trial of Viagra  was ineffective and costly. - Discussed that generic sildenafil  (Viagra ) is now much more affordable with GoodRx. - Provided a paper prescription for sildenafil  100mg , to be taken as a half or full tablet, to trial if desired. - Counseled on non-pharmacological options, including use of a vacuum erection device and constriction rings. - Patient will hold off on filling the prescription at this time.  Orders: -     Testosterone  Other orders -     Celecoxib ; TAKE 1 CAPSULE(200 MG) BY MOUTH DAILY  Dispense: 30 capsule; Refill: 1 -     Sildenafil  Citrate; Take 0.5-1 tablets (50-100 mg total) by mouth daily as needed for erectile dysfunction.  Dispense: 30 tablet; Refill: 1     Return in about 6 months (around 02/15/2025) for HTN, hld.    Toribio MARLA Slain, MD

## 2024-08-18 NOTE — Patient Instructions (Signed)
 It was nice to see you today,  We addressed the following topics today: -I have sent in your prescriptions refills. - I have also sent a prescription for Viagra .  This should cost less than $20 using [facility] - I have printed out the labs I would like to get.  You can check with [facility] to see what it would cost before you get them. - for erectile dysfunction, vacuum pumps and silicone rings in combination are used successfully in many patients.  These are available without a prescription.   - You can take 400 mg of magnesium oxide an hour before bed to help with muscle cramps and sleep.  Have a great day,  Rolan Slain, MD

## 2024-08-23 NOTE — Assessment & Plan Note (Signed)
 Chronic diffuse pain and tightness, particularly in the back and legs, with some relief from massage, stretching, and a massage gun. Celebrex  provides partial relief. Prednisone  is effective for flares. - Continue current home management with stretching and massage gun. - Continue Celebrex  as scheduled. - Continue Prednisone  for flares only. - Refilled Celebrex  prescription.

## 2024-08-23 NOTE — Assessment & Plan Note (Signed)
 Insomnia. Chronic issue with poor sleep maintenance and daytime fatigue, with limited benefit from Ambien . Discussed other potential contributors to fatigue. - Plan to order labs to investigate other causes of fatigue: TSH, CBC, CMP, B12, iron studies, and testosterone. - Provided a lab order form to take to [facility]; counseled on calling to check self-pay pricing beforehand to avoid unexpected costs. - Recommended trying over-the-counter magnesium oxide 400mg  or melatonin 1 hour before bed to aid sleep. - Refilled Ambien  prescription.

## 2024-08-23 NOTE — Assessment & Plan Note (Signed)
 Long-standing history, likely multifactorial including physical and psychological components. Prior trial of Viagra  was ineffective and costly. - Discussed that generic sildenafil  (Viagra ) is now much more affordable with GoodRx. - Provided a paper prescription for sildenafil  100mg , to be taken as a half or full tablet, to trial if desired. - Counseled on non-pharmacological options, including use of a vacuum erection device and constriction rings. - Patient will hold off on filling the prescription at this time.

## 2024-08-26 ENCOUNTER — Telehealth: Payer: Self-pay

## 2024-08-26 NOTE — Progress Notes (Signed)
 Complex Care Management Note Care Guide Note  08/26/2024 Name: Daniel Jimenez MRN: [id number] DOB: 24-Feb-1976   Complex Care Management Outreach Attempts: An unsuccessful telephone outreach was attempted today to offer the patient information about available complex care management services.  Follow Up Plan:  Additional outreach attempts will be made to offer the patient complex care management information and services.   Encounter Outcome:  No Answer  Dreama [facility]  [facility], [facility] VBCI Assistant Direct Dial: [phone]  Fax: [phone]

## 2024-09-01 ENCOUNTER — Other Ambulatory Visit: Payer: Self-pay | Admitting: Family Medicine

## 2024-09-01 DIAGNOSIS — E785 Hyperlipidemia, unspecified: Secondary | ICD-10-CM

## 2024-09-01 NOTE — Progress Notes (Signed)
 Complex Care Management Note  Care Guide Note 09/01/2024 Name: Daniel Jimenez MRN: [id number] DOB: Jul 03, 1976  [name] is a 48 y.o. year old male who sees Chandra Toribio POUR, MD for primary care. I reached out to [name] by phone today to offer complex care management services.  [name] was given information about Complex Care Management services today including:   The Complex Care Management services include support from the care team which includes your Nurse Care Manager, Clinical Social Worker, or Pharmacist.  The Complex Care Management team is here to help remove barriers to the health concerns and goals most important to you. Complex Care Management services are voluntary, and the patient may decline or stop services at any time by request to their care team member.   Complex Care Management Consent Status: Patient did not agree to participate in complex care management services at this time.  Encounter Outcome:  Patient Refused  Dreama Agent [facility], [facility] VBCI Assistant Direct Dial: [phone]  Fax: [phone]

## 2024-09-01 NOTE — Addendum Note (Signed)
 Addended by: CHANDRA TORIBIO POUR on: 09/01/2024 02:59 PM   Modules accepted: Orders

## 2024-09-09 ENCOUNTER — Ambulatory Visit
Admission: RE | Admit: 2024-09-09 | Discharge: 2024-09-09 | Disposition: A | Discharge status: Home / Self Care | Payer: Self-pay | Source: Ambulatory Visit | Attending: Family Medicine | Admitting: Family Medicine

## 2024-09-09 DIAGNOSIS — E785 Hyperlipidemia, unspecified: Secondary | ICD-10-CM | POA: Insufficient documentation

## 2024-09-10 ENCOUNTER — Ambulatory Visit: Payer: Self-pay | Admitting: Family Medicine

## 2025-02-15 ENCOUNTER — Ambulatory Visit: Payer: Self-pay | Admitting: Family Medicine
